# Patient Record
Sex: Male | Born: 1980 | Race: White | Hispanic: No | Marital: Single | Smoking: Current every day smoker
Health system: Southern US, Community
[De-identification: ages and names within clinical notes are randomized; demographics above are authoritative.]

---

## 2012-03-31 ENCOUNTER — Encounter (HOSPITAL_COMMUNITY): Payer: Self-pay

## 2012-03-31 ENCOUNTER — Emergency Department (HOSPITAL_COMMUNITY): Payer: Self-pay

## 2012-03-31 ENCOUNTER — Emergency Department (HOSPITAL_COMMUNITY)
Admission: EM | Admit: 2012-03-31 | Discharge: 2012-03-31 | Disposition: A | Discharge status: Home / Self Care | Payer: Self-pay | Attending: Emergency Medicine | Admitting: Emergency Medicine

## 2012-03-31 DIAGNOSIS — Y9239 Other specified sports and athletic area as the place of occurrence of the external cause: Secondary | ICD-10-CM | POA: Insufficient documentation

## 2012-03-31 DIAGNOSIS — S0003XA Contusion of scalp, initial encounter: Secondary | ICD-10-CM | POA: Insufficient documentation

## 2012-03-31 DIAGNOSIS — R0602 Shortness of breath: Secondary | ICD-10-CM | POA: Insufficient documentation

## 2012-03-31 DIAGNOSIS — R296 Repeated falls: Secondary | ICD-10-CM | POA: Insufficient documentation

## 2012-03-31 DIAGNOSIS — R55 Syncope and collapse: Secondary | ICD-10-CM | POA: Insufficient documentation

## 2012-03-31 DIAGNOSIS — S0180XA Unspecified open wound of other part of head, initial encounter: Secondary | ICD-10-CM | POA: Insufficient documentation

## 2012-03-31 DIAGNOSIS — S1093XA Contusion of unspecified part of neck, initial encounter: Secondary | ICD-10-CM | POA: Insufficient documentation

## 2012-03-31 LAB — CBC
HCT: 46 % (ref 39.0–52.0)
Hemoglobin: 16.2 g/dL (ref 13.0–17.0)
MCH: 30.9 pg (ref 26.0–34.0)
MCV: 87.6 fL (ref 78.0–100.0)
RBC: 5.25 MIL/uL (ref 4.22–5.81)

## 2012-03-31 LAB — DIFFERENTIAL
Eosinophils Absolute: 0.2 10*3/uL (ref 0.0–0.7)
Eosinophils Relative: 2 % (ref 0–5)
Lymphs Abs: 2.3 10*3/uL (ref 0.7–4.0)
Monocytes Absolute: 0.6 10*3/uL (ref 0.1–1.0)
Monocytes Relative: 5 % (ref 3–12)

## 2012-03-31 LAB — URINE MICROSCOPIC-ADD ON

## 2012-03-31 LAB — BASIC METABOLIC PANEL
BUN: 15 mg/dL (ref 6–23)
CO2: 22 mEq/L (ref 19–32)
Calcium: 10.5 mg/dL (ref 8.4–10.5)
GFR calc non Af Amer: 78 mL/min — ABNORMAL LOW (ref >90)
Glucose, Bld: 107 mg/dL — ABNORMAL HIGH (ref 70–99)

## 2012-03-31 LAB — URINALYSIS, ROUTINE W REFLEX MICROSCOPIC
Bilirubin Urine: NEGATIVE
Glucose, UA: NEGATIVE mg/dL
Hgb urine dipstick: NEGATIVE
Protein, ur: 30 mg/dL — AB
Urobilinogen, UA: 0.2 mg/dL (ref 0.0–1.0)

## 2012-03-31 MED ORDER — TETANUS-DIPHTH-ACELL PERTUSSIS 5-2.5-18.5 LF-MCG/0.5 IM SUSP
0.5000 mL | Freq: Once | INTRAMUSCULAR | Status: AC
  Administered 2012-03-31: 0.5 mL via INTRAMUSCULAR
  Filled 2012-03-31: qty 0.5

## 2012-03-31 MED ORDER — SODIUM CHLORIDE 0.9 % IV BOLUS (SEPSIS)
1000.0000 mL | Freq: Once | INTRAVENOUS | Status: AC
  Administered 2012-03-31: 1000 mL via INTRAVENOUS

## 2012-03-31 NOTE — Discharge Instructions (Signed)
Syncope You have had a fainting (syncopal) spell. A fainting episode is a sudden, short-lived loss of consciousness. It results in complete recovery. It occurs because there has been a temporary shortage of oxygen and/or sugar (glucose) to the brain. CAUSES   Blood pressure pills and other medications that may lower blood pressure below normal. Sudden changes in posture (sudden standing).   Over-medication. Take your medications as directed.   Standing too long. This can cause blood to pool in the legs.   Seizure disorders.   Low blood sugar (hypoglycemia) of diabetes. This more commonly causes coma.   Bearing down to go to the bathroom. This can cause your blood pressure to rise suddenly. Your body compensates by making the blood pressure too low when you stop bearing down.   Hardening of the arteries where the brain temporarily does not receive enough blood.   Irregular heart beat and circulatory problems.   Fear, emotional distress, injury, sight of blood, or illness.  Your caregiver will send you home if the syncope was from non-worrisome causes (benign). Depending on your age and health, you may stay to be monitored and observed. If you return home, have someone stay with you if your caregiver feels that is desirable. It is very important to keep all follow-up referrals and appointments in order to properly manage this condition. This is a serious problem which can lead to serious illness and death if not carefully managed.  WARNING: Do not drive or operate machinery until your caregiver feels that it is safe for you to do so. SEEK IMMEDIATE MEDICAL CARE IF:   You have another fainting episode or faint while lying or sitting down. DO NOT DRIVE YOURSELF. Call 911 if no other help is available.   You have chest pain, are feeling sick to your stomach (nausea), vomiting or abdominal pain.   You have an irregular heartbeat or one that is very fast (pulse over 120 beats per minute).    You have a loss of feeling in some part of your body or lose movement in your arms or legs.   You have difficulty with speech, confusion, severe weakness, or visual problems.   You become sweaty and/or feel light headed.  Make sure you are rechecked as instructed. Document Released: 09/28/2005 Document Revised: 09/17/2011 Document Reviewed: 05/19/2007 [employer] Patient Information 2012 [employer]. 

## 2012-03-31 NOTE — ED Notes (Signed)
Per ems, pt was at the gym and had an episode of syncope.  Pt reports that his dad was with him, and that the "episode" lasted for 1 min.  Pt is alert & oriented in triage.

## 2012-03-31 NOTE — ED Provider Notes (Signed)
History  Scribed for Dayton Bailiff, MD, the patient was seen in room APA09/APA09. This chart was scribed by Candelaria Stagers. The patient's care started at 5:03 PM   CSN: [id number]  Arrival date & time 03/31/12  1651   First MD Initiated Contact with Patient 03/31/12 1657      Chief Complaint  Patient presents with  . Seizures     The history is provided by the patient.   Brad Fernandez is a 31 y.o. male who presents to the Emergency Department complaining an episode of syncope that occurred earlier today while at the gym which lasted for about according to his father.  Pt's father states he turned gray, pupils dilated, and experienced SOB.  He denies hitting his head or losing bowel or bladder function.  Pt experienced similar sx about one month ago.      History reviewed. No pertinent past medical history.  History reviewed. No pertinent past surgical history.  No family history on file.  History  Substance Use Topics  . Smoking status: Current Everyday Smoker  . Smokeless tobacco: Not on file  . Alcohol Use: No      Review of Systems  Constitutional: Negative for fever, activity change, appetite change and fatigue.  HENT: Negative for congestion, facial swelling, rhinorrhea and neck pain.   Eyes: Negative for discharge and itching.  Respiratory: Negative for shortness of breath.   Gastrointestinal: Negative for abdominal pain.  Genitourinary: Negative for dysuria and difficulty urinating.  Musculoskeletal: Negative for myalgias, back pain and arthralgias.  Skin: Positive for wound (left forehead). Negative for rash.  Neurological: Positive for syncope.  Psychiatric/Behavioral: Negative for behavioral problems.    Allergies  Review of patient's allergies indicates no known allergies.  Home Medications  No current outpatient prescriptions on file.  BP 94/60  Pulse 81  Temp 97.3 F (36.3 C) (Oral)  Resp 18  Ht 5\' 8"  (1.727 m)  Wt 155 lb (70.308 kg)   BMI 23.57 kg/m2  SpO2 97%  Physical Exam  Nursing note and vitals reviewed. Constitutional: He is oriented to person, place, and time. He appears well-developed and well-nourished. No distress.  HENT:  Head: Normocephalic and atraumatic.       Small superficial laceration and hematoma to the left forehead.    Eyes: EOM are normal. Pupils are equal, round, and reactive to light.  Neck: Neck supple. No tracheal deviation present.  Cardiovascular: Normal rate.   Pulmonary/Chest: Effort normal. No respiratory distress.  Abdominal: Soft. He exhibits no distension.  Musculoskeletal: Normal range of motion. He exhibits no edema.  Neurological: He is alert and oriented to person, place, and time. No sensory deficit.  Skin: Skin is warm and dry.  Psychiatric: He has a normal mood and affect. His behavior is normal.    ED Course  Procedures  DIAGNOSTIC STUDIES: Oxygen Saturation is 97% on room air, normal by my interpretation.    COORDINATION OF CARE:  5:13PM Ordered: CT Head Wo Contrast; CBC; Differential; Basic metabolic panel; Urinalysis, Routine w reflex microscopic   Labs Reviewed  CBC - Abnormal; Notable for the following:    WBC 11.7 (*)     All other components within normal limits  DIFFERENTIAL - Abnormal; Notable for the following:    Neutro Abs 8.5 (*)     All other components within normal limits  BASIC METABOLIC PANEL - Abnormal; Notable for the following:    Glucose, Bld 107 (*)     GFR calc non  Af Amer 78 (*)     GFR calc Af Amer 90 (*)     All other components within normal limits  URINALYSIS, ROUTINE W REFLEX MICROSCOPIC - Abnormal; Notable for the following:    Color, Urine AMBER (*)  BIOCHEMICALS MAY BE AFFECTED BY COLOR   Protein, ur 30 (*)     All other components within normal limits  URINE MICROSCOPIC-ADD ON - Abnormal; Notable for the following:    Bacteria, UA FEW (*)     Casts HYALINE CASTS (*)     All other components within normal limits  URINE  CULTURE   Ct Head Wo Contrast  03/31/2012  *RADIOLOGY REPORT*  Clinical Data: Seizure.  Left forehead blunt trauma and laceration.  CT HEAD WITHOUT CONTRAST  Technique:  Contiguous axial images were obtained from the base of the skull through the vertex without contrast.  Comparison: None.  Findings: Mild left frontal scalp soft tissue swelling is seen.  No evidence of skull fracture or pneumocephalus.  There is no evidence of intracranial hemorrhage, brain edema or other signs of acute infarction.  There is no evidence of intracranial mass lesion or mass effect.  No abnormal extra-axial fluid collections are identified. No evidence of hydrocephalus.  IMPRESSION: Mild left frontal scalp soft tissue swelling.  No evidence of skull fracture or intracranial abnormality.  Original Report Authenticated By: Danae Orleans, M.D.     1. Syncope       MDM  Is consistent with vasovagal syncope. He received 2 L of IV fluid with improvement of his symptoms. EKG is unremarkable. CT head was performed as he struck his head and had evidence of trauma. This was negative. Steri-Strips were applied to the superficial laceration. Will be discharged home. Provided strict return precautions  I personally performed the services described in this documentation, which was scribed in my presence. The recorded information has been reviewed and considered.         Dayton Bailiff, MD 03/31/12 [phone]

## 2012-04-02 LAB — URINE CULTURE
Colony Count: NO GROWTH
Culture  Setup Time: 201306202100

## 2012-10-23 IMAGING — CT CT HEAD W/O CM
1 series · 16 of 30 positions shown, 20 images · non-contrast
Comparison: None.

CLINICAL DATA: Seizure.  Left forehead blunt trauma and laceration.

CT HEAD WITHOUT CONTRAST
TECHNIQUE: Contiguous axial images were obtained from the base of
the skull through the vertex without contrast.

[Series 2: headtrauma 4.8 h37s · axial · 0.45mm/px · z∈[+76,+205]mm · 16 of 30 slices shown, 20 images]
[im 2/30  brain]
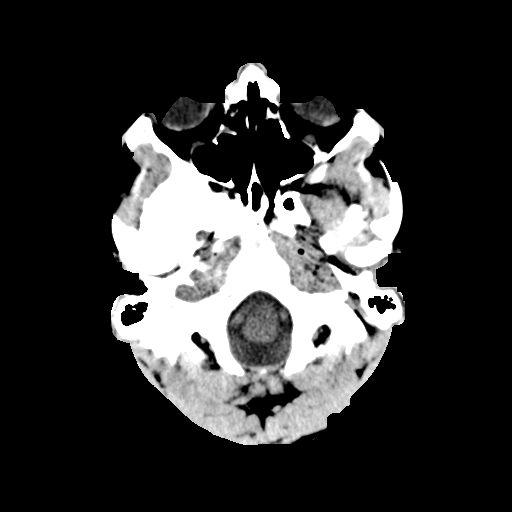
[im 2/30  bone]
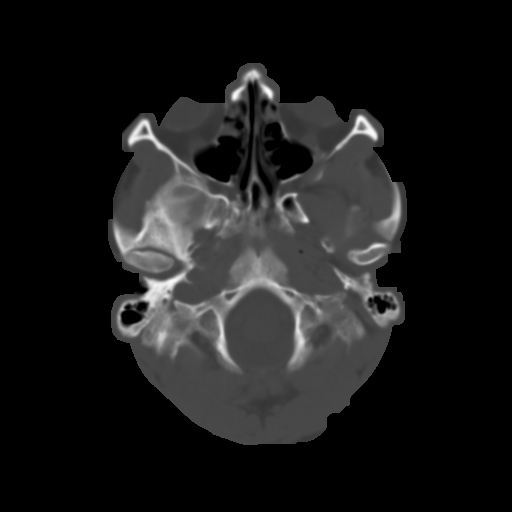
[im 4/30  brain]
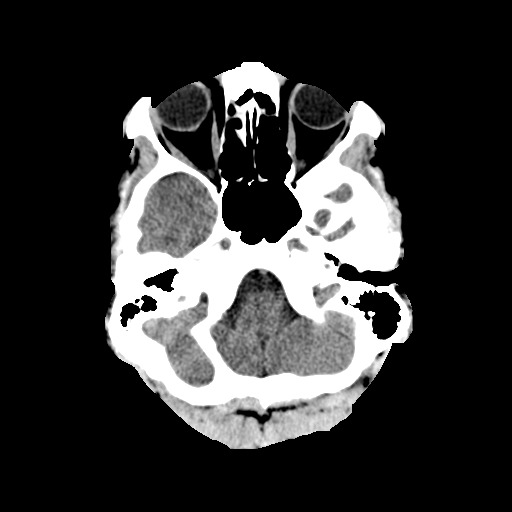
[im 6/30  brain]
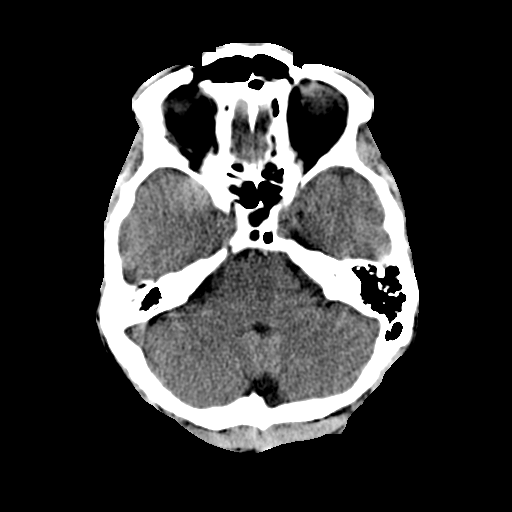
[im 8/30  brain]
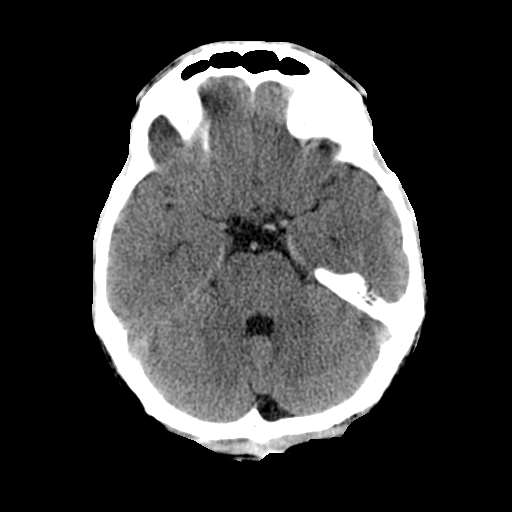
[im 9/30  brain]
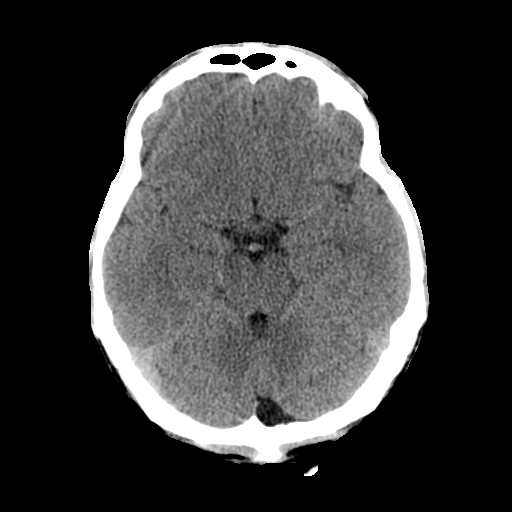
[im 9/30  bone]
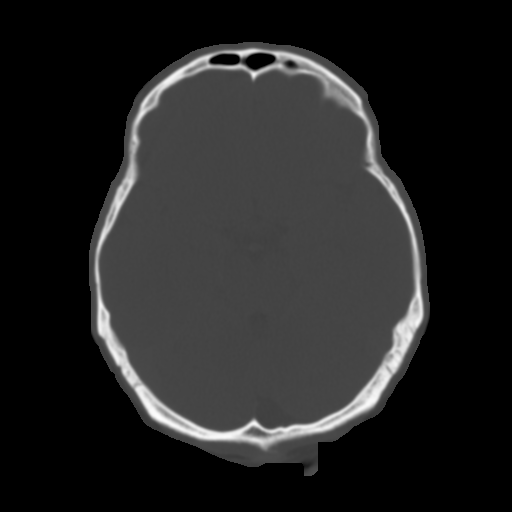
[im 11/30  brain]
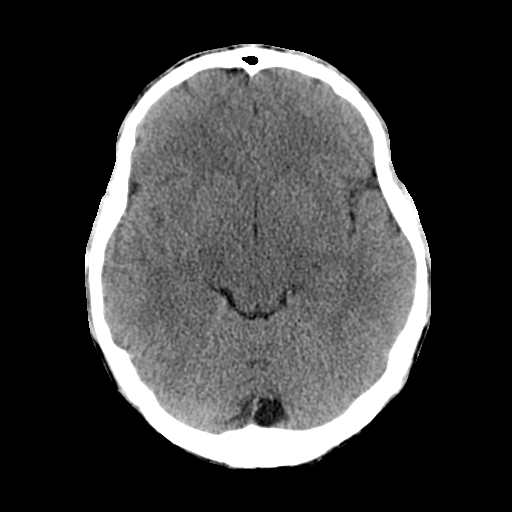
[im 13/30  brain]
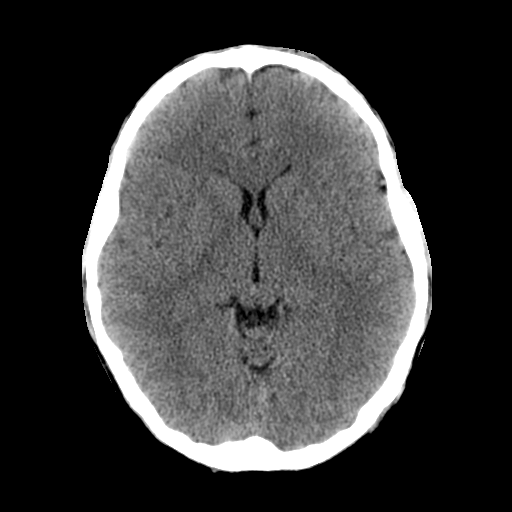
[im 15/30  brain]
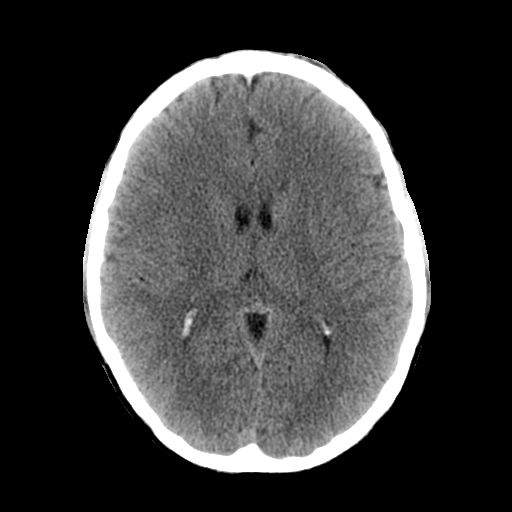
[im 16/30  brain]
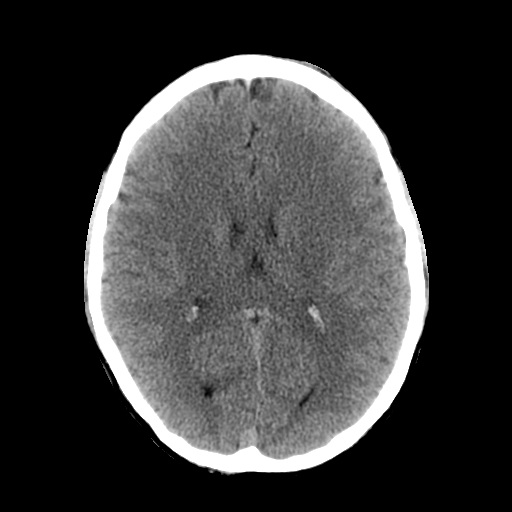
[im 16/30  bone]
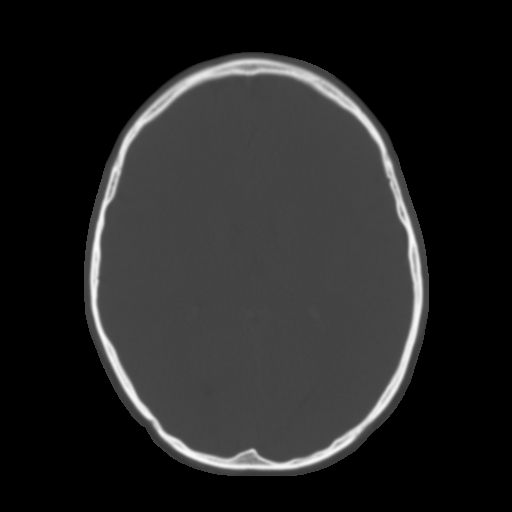
[im 18/30  brain]
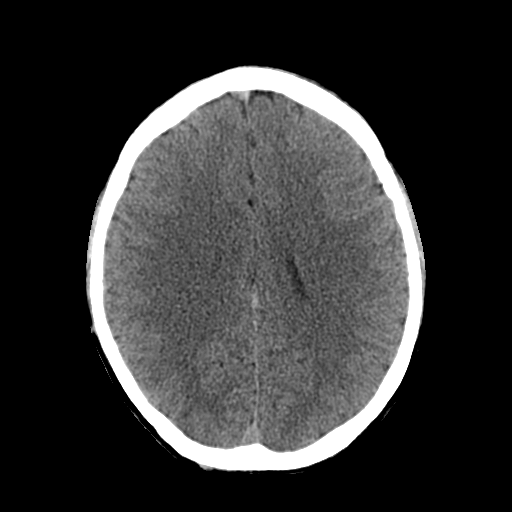
[im 20/30  brain]
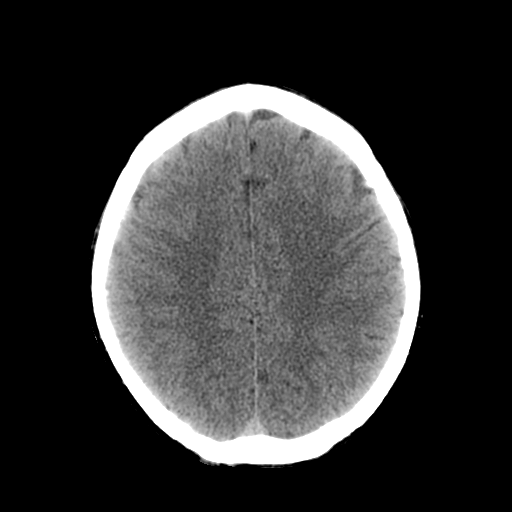
[im 22/30  brain]
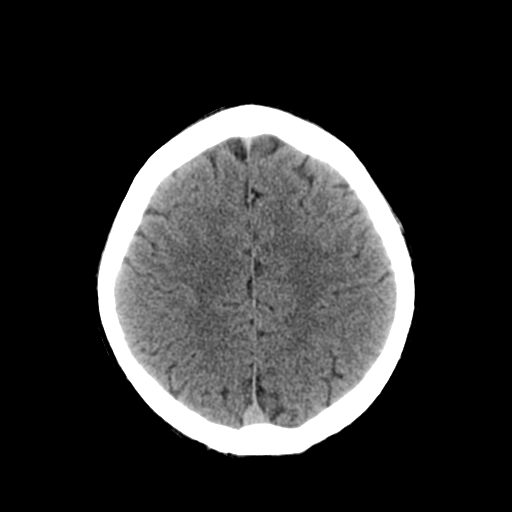
[im 23/30  brain]
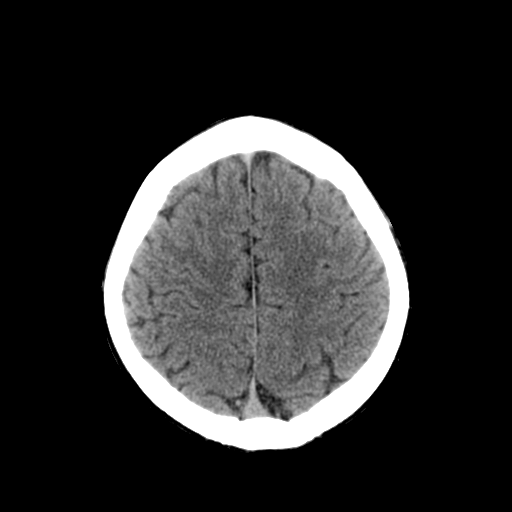
[im 23/30  bone]
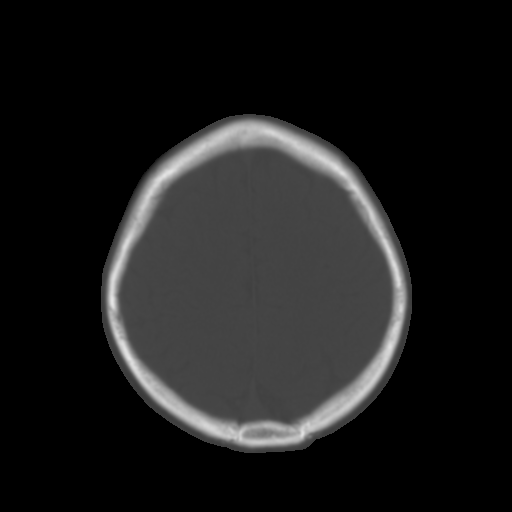
[im 25/30  brain]
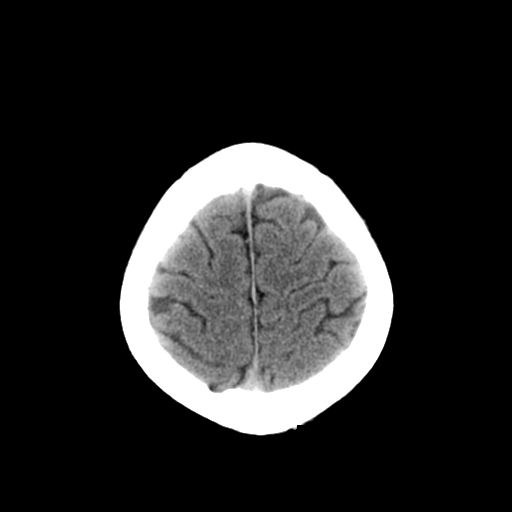
[im 27/30  brain]
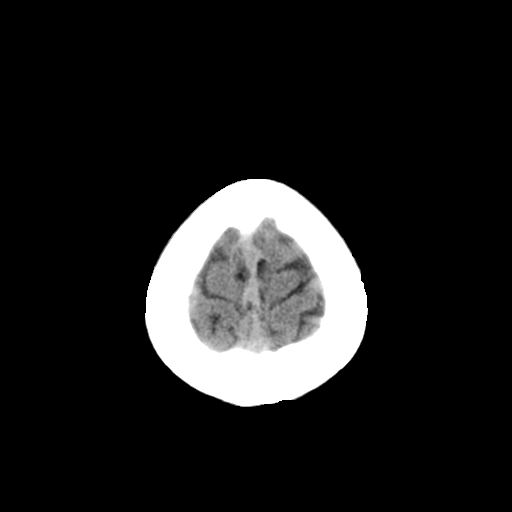
[im 29/30  brain]
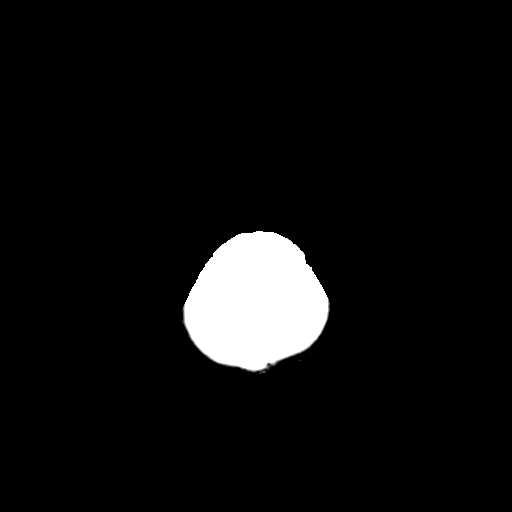

[16 of 30 positions shown; findings below may reference images not displayed]

FINDINGS: Mild left frontal scalp soft tissue swelling is seen.  No
evidence of skull fracture or pneumocephalus.

There is no evidence of intracranial hemorrhage, brain edema or
other signs of acute infarction.  There is no evidence of
intracranial mass lesion or mass effect.  No abnormal extra-axial
fluid collections are identified. No evidence of hydrocephalus.
IMPRESSION: Mild left frontal scalp soft tissue swelling.  No evidence of skull
fracture or intracranial abnormality.

## 2018-06-28 ENCOUNTER — Ambulatory Visit: Payer: Medicaid Other | Admitting: Orthopaedic Surgery

## 2018-06-28 ENCOUNTER — Encounter: Payer: Self-pay | Admitting: Orthopaedic Surgery

## 2018-11-05 ENCOUNTER — Emergency Department (HOSPITAL_COMMUNITY)
Admission: EM | Admit: 2018-11-05 | Discharge: 2018-11-06 | Disposition: A | Discharge status: Home / Self Care | Payer: Medicaid Other | Attending: Emergency Medicine | Admitting: Emergency Medicine

## 2018-11-05 ENCOUNTER — Other Ambulatory Visit: Payer: Self-pay

## 2018-11-05 ENCOUNTER — Encounter (HOSPITAL_COMMUNITY): Payer: Self-pay | Admitting: Emergency Medicine

## 2018-11-05 ENCOUNTER — Emergency Department (HOSPITAL_COMMUNITY): Payer: Medicaid Other

## 2018-11-05 DIAGNOSIS — Z781 Physical restraint status: Secondary | ICD-10-CM | POA: Diagnosis not present

## 2018-11-05 DIAGNOSIS — F1290 Cannabis use, unspecified, uncomplicated: Secondary | ICD-10-CM

## 2018-11-05 DIAGNOSIS — F17200 Nicotine dependence, unspecified, uncomplicated: Secondary | ICD-10-CM | POA: Insufficient documentation

## 2018-11-05 DIAGNOSIS — X788XXA Intentional self-harm by other sharp object, initial encounter: Secondary | ICD-10-CM | POA: Insufficient documentation

## 2018-11-05 DIAGNOSIS — S56222A Laceration of other flexor muscle, fascia and tendon at forearm level, left arm, initial encounter: Secondary | ICD-10-CM | POA: Diagnosis present

## 2018-11-05 DIAGNOSIS — F10921 Alcohol use, unspecified with intoxication delirium: Secondary | ICD-10-CM

## 2018-11-05 DIAGNOSIS — F10121 Alcohol abuse with intoxication delirium: Secondary | ICD-10-CM | POA: Diagnosis not present

## 2018-11-05 DIAGNOSIS — S41112A Laceration without foreign body of left upper arm, initial encounter: Secondary | ICD-10-CM

## 2018-11-05 DIAGNOSIS — T1491XA Suicide attempt, initial encounter: Secondary | ICD-10-CM

## 2018-11-05 DIAGNOSIS — S51811A Laceration without foreign body of right forearm, initial encounter: Secondary | ICD-10-CM | POA: Diagnosis not present

## 2018-11-05 DIAGNOSIS — Y92003 Bedroom of unspecified non-institutional (private) residence as the place of occurrence of the external cause: Secondary | ICD-10-CM | POA: Diagnosis not present

## 2018-11-05 DIAGNOSIS — S41111A Laceration without foreign body of right upper arm, initial encounter: Secondary | ICD-10-CM

## 2018-11-05 LAB — URINALYSIS, ROUTINE W REFLEX MICROSCOPIC
Bilirubin Urine: NEGATIVE
GLUCOSE, UA: NEGATIVE mg/dL
KETONES UR: NEGATIVE mg/dL
LEUKOCYTES UA: NEGATIVE
NITRITE: NEGATIVE
PROTEIN: NEGATIVE mg/dL
Specific Gravity, Urine: 1.002 — ABNORMAL LOW (ref 1.005–1.030)
pH: 6 (ref 5.0–8.0)

## 2018-11-05 LAB — CBC WITH DIFFERENTIAL/PLATELET
ABS IMMATURE GRANULOCYTES: 0.04 10*3/uL (ref 0.00–0.07)
BASOS ABS: 0.1 10*3/uL (ref 0.0–0.1)
BASOS PCT: 0 %
Eosinophils Absolute: 0 10*3/uL (ref 0.0–0.5)
Eosinophils Relative: 0 %
HCT: 47.5 % (ref 39.0–52.0)
Hemoglobin: 15.7 g/dL (ref 13.0–17.0)
IMMATURE GRANULOCYTES: 0 %
LYMPHS PCT: 11 %
Lymphs Abs: 1.5 10*3/uL (ref 0.7–4.0)
MCH: 28.9 pg (ref 26.0–34.0)
MCHC: 33.1 g/dL (ref 30.0–36.0)
MCV: 87.3 fL (ref 80.0–100.0)
Monocytes Absolute: 0.4 10*3/uL (ref 0.1–1.0)
Monocytes Relative: 3 %
NEUTROS ABS: 11.4 10*3/uL — AB (ref 1.7–7.7)
NEUTROS PCT: 86 %
NRBC: 0 % (ref 0.0–0.2)
PLATELETS: 294 10*3/uL (ref 150–400)
RBC: 5.44 MIL/uL (ref 4.22–5.81)
RDW: 12.4 % (ref 11.5–15.5)
WBC: 13.4 10*3/uL — AB (ref 4.0–10.5)

## 2018-11-05 LAB — COMPREHENSIVE METABOLIC PANEL
ALBUMIN: 4.3 g/dL (ref 3.5–5.0)
ALT: 13 U/L (ref 0–44)
AST: 33 U/L (ref 15–41)
Alkaline Phosphatase: 54 U/L (ref 38–126)
Anion gap: 11 (ref 5–15)
BUN: 6 mg/dL (ref 6–20)
CHLORIDE: 108 mmol/L (ref 98–111)
CO2: 27 mmol/L (ref 22–32)
Calcium: 9.1 mg/dL (ref 8.9–10.3)
Creatinine, Ser: 1.06 mg/dL (ref 0.61–1.24)
GFR calc non Af Amer: >60 mL/min (ref >60)
GLUCOSE: 107 mg/dL — AB (ref 70–99)
Potassium: 3.6 mmol/L (ref 3.5–5.1)
SODIUM: 146 mmol/L — AB (ref 135–145)
Total Bilirubin: 0.5 mg/dL (ref 0.3–1.2)
Total Protein: 7.1 g/dL (ref 6.5–8.1)

## 2018-11-05 LAB — ACETAMINOPHEN LEVEL: Acetaminophen (Tylenol), Serum: <10 ug/mL — ABNORMAL LOW (ref 10–30)

## 2018-11-05 LAB — TYPE AND SCREEN
ABO/RH(D): A POS
Antibody Screen: NEGATIVE

## 2018-11-05 LAB — SALICYLATE LEVEL: Salicylate Lvl: <7 mg/dL (ref 2.8–30.0)

## 2018-11-05 LAB — RAPID URINE DRUG SCREEN, HOSP PERFORMED
Amphetamines: NOT DETECTED
Barbiturates: NOT DETECTED
Benzodiazepines: NOT DETECTED
Cocaine: NOT DETECTED
Opiates: NOT DETECTED
Tetrahydrocannabinol: POSITIVE — AB

## 2018-11-05 LAB — ETHANOL: ALCOHOL ETHYL (B): 273 mg/dL — AB (ref <10)

## 2018-11-05 LAB — CK: CK TOTAL: 659 U/L — AB (ref 49–397)

## 2018-11-05 MED ORDER — HALOPERIDOL LACTATE 5 MG/ML IJ SOLN: 5.0000 mg | Freq: Once | INTRAMUSCULAR | Status: DC

## 2018-11-05 MED ORDER — TETANUS-DIPHTH-ACELL PERTUSSIS 5-2.5-18.5 LF-MCG/0.5 IM SUSP
0.5000 mL | Freq: Once | INTRAMUSCULAR | Status: AC
  Administered 2018-11-05: 0.5 mL via INTRAMUSCULAR
  Filled 2018-11-05: qty 0.5

## 2018-11-05 MED ORDER — FENTANYL CITRATE (PF) 100 MCG/2ML IJ SOLN: 50.0000 ug | Freq: Once | INTRAMUSCULAR | Status: DC

## 2018-11-05 MED ORDER — HALOPERIDOL LACTATE 5 MG/ML IJ SOLN
5.0000 mg | Freq: Once | INTRAMUSCULAR | Status: AC
  Administered 2018-11-05: 5 mg via INTRAMUSCULAR
  Filled 2018-11-05: qty 1

## 2018-11-05 MED ORDER — MIDAZOLAM HCL 2 MG/2ML IJ SOLN
5.0000 mg | Freq: Once | INTRAMUSCULAR | Status: AC
  Administered 2018-11-05: 5 mg via INTRAMUSCULAR

## 2018-11-05 MED ORDER — MIDAZOLAM HCL 2 MG/2ML IJ SOLN
5.0000 mg | Freq: Once | INTRAMUSCULAR | Status: DC
  Filled 2018-11-05: qty 6

## 2018-11-05 MED ORDER — MIDAZOLAM HCL 5 MG/5ML IJ SOLN: 5.0000 mg | Freq: Once | INTRAMUSCULAR | Status: DC

## 2018-11-05 NOTE — ED Notes (Signed)
Pt placed on end tidal CO2.

## 2018-11-05 NOTE — ED Provider Notes (Signed)
[facility] EMERGENCY DEPARTMENT Provider Note   CSN: [id number] Arrival date & time: 11/05/18  2039     History   Chief Complaint Chief Complaint  Patient presents with  . Suicide Attempt  . Alcohol Intoxication    HPI Brad Fernandez is a 38 y.o. male.  HPI   Brad Fernandez is a 38 y.o. male with PMH of unknown who presents with refill on foreskin at the bedside with possible intoxication versus other cause for abdominal status and reportedly self-inflicted lacerations to both forearms.  The patient reportedly was at home with his wife when he cut both of his forearms.  Report of possible ethanol intoxication.  EMS had significant difficulty obtaining any vital signs or glucose prior to arrival as the patient was very combative.  And cuffs on initial presentation.  Spoke to the patient's mother at [phone] he stated they were on their way to the ED.  She states that the patient's wife is not coming to the ED but was with him at the house earlier this evening when she had called the patient's parents after he reportedly cut both of his arms.  He reportedly had alcohol tonight but they are not sure how much.  He reportedly uses marijuana intermittently but there is no known use of other drugs.  No diagnosed psychiatric disorders in the past and they have not noticed signs or symptoms consistent with mania or schizophrenia per her report.  He has appeared to be more stressed recently and she is not sure that he has been sleeping well but nobody has confirmed insomnia.  History reviewed. No pertinent past medical history.  There are no active problems to display for this patient.   History reviewed. No pertinent surgical history.      Home Medications    Prior to Admission medications   Not on File    Family History No family history on file.  Social History Social History   Tobacco Use  . Smoking status: Current Every Day Smoker  . Smokeless  tobacco: Never Used  Substance Use Topics  . Alcohol use: Yes  . Drug use: No     Allergies   Patient has no known allergies.   Review of Systems Review of Systems  Unable to perform ROS: Mental status change     Physical Exam Updated Vital Signs BP 120/80   Pulse 79   Temp 98.2 F (36.8 C) (Oral)   Resp 17   Ht 5\' 8"  (1.727 m)   Wt 63.5 kg   SpO2 95%   BMI 21.29 kg/m   Physical Exam Vitals signs and nursing note reviewed.  Constitutional:      General: He is in acute distress.     Appearance: He is well-developed. He is ill-appearing.  HENT:     Head: Normocephalic and atraumatic.  Eyes:     Conjunctiva/sclera: Conjunctivae normal.  Neck:     Musculoskeletal: Neck supple.  Cardiovascular:     Rate and Rhythm: Regular rhythm. Tachycardia present.     Pulses:          Radial pulses are 2+ on the right side and 2+ on the left side.     Heart sounds: No murmur.     Comments: HR 90s while not upset, 110s when more agitated. Pulmonary:     Effort: Pulmonary effort is normal. No respiratory distress.     Breath sounds: Normal breath sounds.  Abdominal:     Palpations: Abdomen  is soft.     Tenderness: There is no abdominal tenderness.  Skin:    General: Skin is warm and dry.  Neurological:     Mental Status: He is alert. He is confused.     GCS: GCS eye subscore is 4. GCS verbal subscore is 4. GCS motor subscore is 6.     Comments: Moves all 4 extremities. Appears to have intact flexor/extensor mechanisms of both left and right hands/forearms. Will not answer questions re sensation but withdraws to pain.   Psychiatric:        Behavior: Behavior is uncooperative.       ED Treatments / Results  Labs (all labs ordered are listed, but only abnormal results are displayed) Labs Reviewed  CBC WITH DIFFERENTIAL/PLATELET - Abnormal; Notable for the following components:      Result Value   WBC 13.4 (*)    Neutro Abs 11.4 (*)    All other components within  normal limits  COMPREHENSIVE METABOLIC PANEL - Abnormal; Notable for the following components:   Sodium 146 (*)    Glucose, Bld 107 (*)    All other components within normal limits  ACETAMINOPHEN LEVEL - Abnormal; Notable for the following components:   Acetaminophen (Tylenol), Serum <10 (*)    All other components within normal limits  ETHANOL - Abnormal; Notable for the following components:   Alcohol, Ethyl (B) 273 (*)    All other components within normal limits  URINALYSIS, ROUTINE W REFLEX MICROSCOPIC - Abnormal; Notable for the following components:   Color, Urine STRAW (*)    Specific Gravity, Urine 1.002 (*)    Hgb urine dipstick LARGE (*)    Bacteria, UA RARE (*)    All other components within normal limits  RAPID URINE DRUG SCREEN, HOSP PERFORMED - Abnormal; Notable for the following components:   Tetrahydrocannabinol POSITIVE (*)    All other components within normal limits  CK - Abnormal; Notable for the following components:   Total CK 659 (*)    All other components within normal limits  SALICYLATE LEVEL  CBG MONITORING, ED  TYPE AND SCREEN  ABO/RH    EKG EKG Interpretation  Date/Time:  Saturday November 05 2018 20:42:37 EST Ventricular Rate:  100 PR Interval:    QRS Duration: 104 QT Interval:  340 QTC Calculation: 439 R Axis:   88 Text Interpretation:  Sinus tachycardia No previous tracing Confirmed by Cathren LaineSteinl, Kevin ([id number]) on 11/05/2018 10:46:40 PM   Radiology No results found.  Procedures Procedures (including critical care time)  Medications Ordered in ED Medications  fentaNYL (SUBLIMAZE) injection 50 mcg (has no administration in time range)  ceFAZolin (ANCEF) 2-4 GM/100ML-% IVPB (has no administration in time range)  haloperidol lactate (HALDOL) injection 5 mg (5 mg Intramuscular Given 11/05/18 2048)  Tdap (BOOSTRIX) injection 0.5 mL (0.5 mLs Intramuscular Given 11/05/18 2157)  midazolam (VERSED) injection 5 mg (5 mg Intramuscular Given 11/05/18  2153)     Initial Impression / Assessment and Plan / ED Course  I have reviewed the triage vital signs and the nursing notes.  Pertinent labs & imaging results that were available during my care of the patient were reviewed by me and considered in my medical decision making (see chart for details).     MDM:  Imaging: X-rays of the bilateral forearms ordered but not yet completed  ED Provider Interpretation of EKG: Sinus tachycardia with a rate of 100 bpm, normal axis, no ST segment elevation or depression, pathologic T wave  changes or significant interval regularity.  No bundle-branch blocks.  No delta wave.  No evidence for Brugada syndrome.  Labs: UDS positive for THC, UA with no evidence for infection, CK 659, salicylate negative, alcohol 273, Tylenol 10, CMP unremarkable, white count 13  On initial evaluation, patient appears ill and is agitated. Afebrile and hemodynamically stable.  Alert but disoriented and altered.  Presents after apparently causing some infected lacerations to both forearms as detailed above.  On exam, patient has large forearm laceration on the left spanning anterior to the antecubital fossa to the wrist predominantly over the volar surface.  Hemostatic.  Approximately 6 cm laceration to the right proximal forearm anterior to the antecubital fossa which is hemostatic.  No evidence for acute arterial injury.  2+ radial pulses bilaterally.  Patient is largely noncompliant with commands but is spontaneously flexing and extending his wrists and fingers on both hands without significant difficulty.  He was in handcuffs during evaluation and was altered, thrashing about the bed, and attempting to bite providers at times.  Not redirectable by voice.  Patient was found to be normotensive and not tachycardic he was given 5 mg of IM Haldol and tetanus booster in the ED.  Thereafter patient initially called but then became more agitated and was given 5 mg of IM Versed.  He was more  calm thereafter.  End-tidal CO2 placed.  Given his significant agitation and apparent intoxication with attempt to harm himself IVC was placed.  X-rays ordered but with stimulation the patient to get more agitated.  Ethanol significantly elevated as above and UDS positive for THC.  Otherwise no evidence for significant metabolic derangement.  Suspect his leukocytosis is secondary to demargination in the setting of trauma and intoxication.  He is not anticoagulated hemostatic.  On multiple attempts to perform exam there was some difficulty ensuring that the patient was neurovascularly intact.  With coaching the patient was able to give "okay sign" with his left hand.  He would not attempt to cross his fingers or touch pinky to thumb.  Pulses remained intact.  Extensive laceration to the left arm with possible tendon involvement and muscle belly involvement as well.  Felt that he might be better served in the operating room for repair of this and hand surgery was consulted.  Given IV fentanyl for pain.  He was taken to the operating room by hand surgery.  Plan for the patient to return to the emergency department after surgery for reevaluation.  He will require metabolism of his alcohol and will need psychiatric reassessment by the ED team.  May require TTS evaluation.  I spoke extensively to the patient's parents in the consultation room and gave them a general idea of his condition and the extent of his wounds requiring operative repair.  They spontaneously provided that when the patient gets very drunk he has been predisposed to act in a similar fashion in the past.  Suspect that most of his symptoms are secondary to alcohol and drug use.  No known psych history.  I attempted to call the patient's wife at the phone number provided by the patient's mother [phone]) but was unable to contact her.  Patient was taken to the operating room around the time of transfer of care to oncoming team.  Please see their  notes for additional details.  The plan for this patient was discussed with Dr. Denton Lank who voiced agreement and who oversaw evaluation and treatment of this patient.    Final Clinical  Impressions(s) / ED Diagnoses   Final diagnoses:  Alcohol intoxication with delirium (HCC)  Suicide attempt (HCC)  Marijuana use  Laceration of left upper extremity with complication, initial encounter  Arm laceration, right, initial encounter    ED Discharge Orders    None       Alin Hutchins, Sherryle LisJames F II, MD 11/06/18 Georgiann [id number]    Cathren LaineSteinl, Kevin, MD 11/08/18 1226

## 2018-11-05 NOTE — ED Triage Notes (Signed)
Pt BIB [facility] and [place] [facility] in handcuffs for a suicide attempt and ETOH intoxication. EMS was called to assist [facility] for a laceration. EMS advised they arrived on scene and the pt has lacerated his left arm to the bone and his right arm to the muscle. EMS advised the pt has been combative the entire time. EMS advised they were unable to get vital signs on the pt and were unable to bandage the wounds. EMS and [place] [facility] advised the pt consumed ETOH but they are not sure if he ingested anything else.  Pt states there is nothing wrong with him and he does not want to be touched.

## 2018-11-06 ENCOUNTER — Encounter (HOSPITAL_COMMUNITY): Payer: Self-pay | Admitting: Behavioral Health

## 2018-11-06 ENCOUNTER — Emergency Department (HOSPITAL_COMMUNITY): Payer: Medicaid Other | Admitting: Anesthesiology

## 2018-11-06 ENCOUNTER — Encounter (HOSPITAL_COMMUNITY)
Admission: EM | Disposition: A | Discharge status: Home / Self Care | Payer: Self-pay | Source: Home / Self Care | Attending: Emergency Medicine

## 2018-11-06 HISTORY — PX: LACERATION REPAIR: SHX5284

## 2018-11-06 LAB — ABO/RH: ABO/RH(D): A POS

## 2018-11-06 SURGERY — REPAIR, LACERATION, 2 OR MORE
Anesthesia: General | Laterality: Right

## 2018-11-06 MED ORDER — CEFAZOLIN SODIUM-DEXTROSE 2-4 GM/100ML-% IV SOLN
INTRAVENOUS | Status: AC
  Filled 2018-11-06: qty 100

## 2018-11-06 MED ORDER — CEFAZOLIN SODIUM-DEXTROSE 2-3 GM-%(50ML) IV SOLR
INTRAVENOUS | Status: DC | PRN
  Administered 2018-11-06: 2 g via INTRAVENOUS

## 2018-11-06 MED ORDER — DEXMEDETOMIDINE HCL IN NACL 200 MCG/50ML IV SOLN
INTRAVENOUS | Status: DC | PRN
  Administered 2018-11-06: 8 ug via INTRAVENOUS
  Administered 2018-11-06: 2 ug via INTRAVENOUS
  Administered 2018-11-06 (×2): 4 ug via INTRAVENOUS

## 2018-11-06 MED ORDER — PROPOFOL 10 MG/ML IV BOLUS
INTRAVENOUS | Status: AC
  Filled 2018-11-06: qty 20

## 2018-11-06 MED ORDER — LIDOCAINE HCL (CARDIAC) PF 100 MG/5ML IV SOSY
PREFILLED_SYRINGE | INTRAVENOUS | Status: DC | PRN
  Administered 2018-11-06: 80 mg via INTRATRACHEAL

## 2018-11-06 MED ORDER — SODIUM CHLORIDE 0.9 % IR SOLN
Status: DC | PRN
  Administered 2018-11-06: 1000 mL

## 2018-11-06 MED ORDER — PROPOFOL 10 MG/ML IV BOLUS
INTRAVENOUS | Status: DC | PRN
  Administered 2018-11-06: 150 mg via INTRAVENOUS

## 2018-11-06 MED ORDER — MIDAZOLAM HCL 2 MG/2ML IJ SOLN
INTRAMUSCULAR | Status: AC
  Filled 2018-11-06: qty 2

## 2018-11-06 MED ORDER — FENTANYL CITRATE (PF) 250 MCG/5ML IJ SOLN
INTRAMUSCULAR | Status: AC
  Filled 2018-11-06: qty 5

## 2018-11-06 MED ORDER — SUCCINYLCHOLINE 20MG/ML (10ML) SYRINGE FOR MEDFUSION PUMP - OPTIME
INTRAMUSCULAR | Status: DC | PRN
  Administered 2018-11-06: 100 mg via INTRAVENOUS

## 2018-11-06 MED ORDER — LACTATED RINGERS IV SOLN
INTRAVENOUS | Status: DC | PRN
  Administered 2018-11-06: 01:00:00 via INTRAVENOUS

## 2018-11-06 MED ORDER — MIDAZOLAM HCL 2 MG/2ML IJ SOLN
INTRAMUSCULAR | Status: DC | PRN
  Administered 2018-11-06: 2 mg via INTRAVENOUS

## 2018-11-06 MED ORDER — DEXMEDETOMIDINE HCL IN NACL 200 MCG/50ML IV SOLN
INTRAVENOUS | Status: AC
  Filled 2018-11-06: qty 50

## 2018-11-06 SURGICAL SUPPLY — 47 items
BANDAGE ACE 3X5.8 VEL STRL LF (GAUZE/BANDAGES/DRESSINGS) ×3 IMPLANT
BANDAGE ACE 4X5 VEL STRL LF (GAUZE/BANDAGES/DRESSINGS) ×3 IMPLANT
BNDG CMPR 9X4 STRL LF SNTH (GAUZE/BANDAGES/DRESSINGS)
BNDG COHESIVE 4X5 TAN STRL (GAUZE/BANDAGES/DRESSINGS) ×3 IMPLANT
BNDG CONFORM 2 STRL LF (GAUZE/BANDAGES/DRESSINGS) IMPLANT
BNDG ESMARK 4X9 LF (GAUZE/BANDAGES/DRESSINGS) IMPLANT
BNDG GAUZE ELAST 4 BULKY (GAUZE/BANDAGES/DRESSINGS) ×6 IMPLANT
CORDS BIPOLAR (ELECTRODE) IMPLANT
COVER SURGICAL LIGHT HANDLE (MISCELLANEOUS) ×6 IMPLANT
COVER WAND RF STERILE (DRAPES) ×3 IMPLANT
CUFF TOURNIQUET SINGLE 18IN (TOURNIQUET CUFF) ×3 IMPLANT
DECANTER SPIKE VIAL GLASS SM (MISCELLANEOUS) ×3 IMPLANT
DRAPE SURG 17X23 STRL (DRAPES) ×3 IMPLANT
DURAPREP 26ML APPLICATOR (WOUND CARE) ×3 IMPLANT
GAUZE PACKING IODOFORM 1/4X15 (GAUZE/BANDAGES/DRESSINGS) IMPLANT
GAUZE PACKING IODOFORM 1/4X5 (PACKING) IMPLANT
GAUZE SPONGE 4X4 12PLY STRL (GAUZE/BANDAGES/DRESSINGS) ×6 IMPLANT
GAUZE SPONGE 4X4 12PLY STRL LF (GAUZE/BANDAGES/DRESSINGS) ×6 IMPLANT
GAUZE XEROFORM 1X8 LF (GAUZE/BANDAGES/DRESSINGS) ×3 IMPLANT
GAUZE XEROFORM 5X9 LF (GAUZE/BANDAGES/DRESSINGS) ×6 IMPLANT
GLOVE SURG SYN 8.0 (GLOVE) ×3 IMPLANT
GOWN STRL REUS W/ TWL LRG LVL3 (GOWN DISPOSABLE) ×1 IMPLANT
GOWN STRL REUS W/ TWL XL LVL3 (GOWN DISPOSABLE) ×1 IMPLANT
GOWN STRL REUS W/TWL LRG LVL3 (GOWN DISPOSABLE) ×3
GOWN STRL REUS W/TWL XL LVL3 (GOWN DISPOSABLE) ×3
KIT BASIN OR (CUSTOM PROCEDURE TRAY) ×3 IMPLANT
KIT TURNOVER KIT B (KITS) ×3 IMPLANT
MANIFOLD NEPTUNE II (INSTRUMENTS) ×3 IMPLANT
NEEDLE HYPO 25GX1X1/2 BEV (NEEDLE) IMPLANT
NEEDLE HYPO 25X1 1.5 SAFETY (NEEDLE) ×3 IMPLANT
NS IRRIG 1000ML POUR BTL (IV SOLUTION) ×3 IMPLANT
PACK ORTHO EXTREMITY (CUSTOM PROCEDURE TRAY) ×3 IMPLANT
PAD ARMBOARD 7.5X6 YLW CONV (MISCELLANEOUS) ×6 IMPLANT
PAD CAST 4YDX4 CTTN HI CHSV (CAST SUPPLIES) ×2 IMPLANT
PADDING CAST COTTON 4X4 STRL (CAST SUPPLIES) ×6
SPONGE LAP 18X18 X RAY DECT (DISPOSABLE) ×3 IMPLANT
SUT VICRYL RAPIDE 4/0 PS 2 (SUTURE) IMPLANT
SWAB COLLECTION DEVICE MRSA (MISCELLANEOUS) ×3 IMPLANT
SWAB CULTURE ESWAB REG 1ML (MISCELLANEOUS) IMPLANT
SYR CONTROL 10ML LL (SYRINGE) ×3 IMPLANT
TAPE CLOTH SURG 4X10 WHT LF (GAUZE/BANDAGES/DRESSINGS) ×3 IMPLANT
TOWEL OR 17X24 6PK STRL BLUE (TOWEL DISPOSABLE) ×3 IMPLANT
TOWEL OR 17X26 10 PK STRL BLUE (TOWEL DISPOSABLE) ×3 IMPLANT
TUBE CONNECTING 12'X1/4 (SUCTIONS) ×1
TUBE CONNECTING 12X1/4 (SUCTIONS) ×2 IMPLANT
UNDERPAD 30X30 (UNDERPADS AND DIAPERS) ×3 IMPLANT
YANKAUER SUCT BULB TIP NO VENT (SUCTIONS) ×3 IMPLANT

## 2018-11-06 NOTE — Transfer of Care (Signed)
Immediate Anesthesia Transfer of Care Note  Patient: Brad Fernandez  Procedure(s) Performed: REPAIR RIGHT FOREARM LACERATIONS (Right )  Patient Location: PACU  Anesthesia Type:General  Level of Consciousness: sedated  Airway & Oxygen Therapy: Patient connected to face mask oxygen  Post-op Assessment: Report given to RN and Post -op Vital signs reviewed and stable  Post vital signs: Reviewed and stable  Last Vitals:  Vitals Value Taken Time  BP 114/84 11/06/2018  1:55 AM  Temp 36.2 C 11/06/2018  1:53 AM  Pulse 65 11/06/2018  2:02 AM  Resp 21 11/06/2018  2:02 AM  SpO2 99 % 11/06/2018  2:02 AM  Vitals shown include unvalidated device data.  Last Pain:  Vitals:   11/06/18 0153  TempSrc:   PainSc: Asleep         Complications: No apparent anesthesia complications

## 2018-11-06 NOTE — ED Notes (Addendum)
Per Lorin Picket, RN, patient is not sober and unable to complete TTS assessment. TTS to attempt at a later time. BAL 273
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
## 2018-11-06 NOTE — Anesthesia Postprocedure Evaluation (Signed)
Anesthesia Post Note  Patient: Brad Fernandez  Procedure(s) Performed: REPAIR RIGHT FOREARM LACERATIONS, REPAIR OF LEFT FOREARM LACERATIONS WITH MUSCLE REPAIR. (Right )     Patient location during evaluation: PACU Anesthesia Type: General Level of consciousness: patient uncooperative Pain management: pain level controlled Vital Signs Assessment: post-procedure vital signs reviewed and stable Respiratory status: spontaneous breathing, nonlabored ventilation, respiratory function stable and patient connected to nasal cannula oxygen Cardiovascular status: blood pressure returned to baseline and stable Postop Assessment: no apparent nausea or vomiting Anesthetic complications: no    Last Vitals:  Vitals:   11/06/18 0200 11/06/18 0215  BP: 104/75 101/79  Pulse: 65 62  Resp: 20 20  Temp:  (!) 36.2 C  SpO2: 100% 95%    Last Pain:  Vitals:   11/06/18 0215  TempSrc:   PainSc: Asleep                 Cecile Hearing
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
## 2018-11-06 NOTE — Anesthesia Procedure Notes (Signed)
Procedure Name: Intubation Date/Time: 11/06/2018 1:03 AM Performed by: Molli Hazard, CRNA Pre-anesthesia Checklist: Patient identified, Emergency Drugs available, Suction available and Patient being monitored Patient Re-evaluated:Patient Re-evaluated prior to induction Oxygen Delivery Method: Circle system utilized Preoxygenation: Pre-oxygenation with 100% oxygen Induction Type: IV induction, Rapid sequence and Cricoid Pressure applied Laryngoscope Size: Miller and 2 Grade View: Grade I Tube type: Oral Tube size: 7.5 mm Number of attempts: 1 Airway Equipment and Method: Stylet Placement Confirmation: ETT inserted through vocal cords under direct vision,  positive ETCO2 and breath sounds checked- equal and bilateral Secured at: 23 cm Tube secured with: Tape Dental Injury: Teeth and Oropharynx as per pre-operative assessment
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
## 2018-11-06 NOTE — Consult Note (Signed)
Reason for Consult: Bilateral volar forearm and wrist lacerations Referring Physician: Dr. Cathren LaineKevin Fernandez  Brad MoorLarry Fernandez is an 38 y.o. male.  HPI: With a acute history of mental agitation and bilateral volar wrist lacerations of unknown origin who presents today with agitation and questionable tendon artery nerve injury to bilateral upper extremities.  History reviewed. No pertinent past medical history.  History reviewed. No pertinent surgical history.  No family history on file.  Social History:  reports that he has been smoking. He has never used smokeless tobacco. He reports current alcohol use. He reports that he does not use drugs.  Allergies: No Known Allergies  Medications: Prior to Admission: (Not in a hospital admission)   Results for orders placed or performed during the hospital encounter of 11/05/18 (from the past 48 hour(s))  Urinalysis, Routine w reflex microscopic     Status: Abnormal   Collection Time: 11/05/18  1:41 AM  Result Value Ref Range   Color, Urine STRAW (A) YELLOW   APPearance CLEAR CLEAR   Specific Gravity, Urine 1.002 (L) 1.005 - 1.030   pH 6.0 5.0 - 8.0   Glucose, UA NEGATIVE NEGATIVE mg/dL   Hgb urine dipstick LARGE (A) NEGATIVE   Bilirubin Urine NEGATIVE NEGATIVE   Ketones, ur NEGATIVE NEGATIVE mg/dL   Protein, ur NEGATIVE NEGATIVE mg/dL   Nitrite NEGATIVE NEGATIVE   Leukocytes, UA NEGATIVE NEGATIVE   RBC / HPF 0-5 0 - 5 RBC/hpf   Bacteria, UA RARE (A) NONE SEEN   Mucus PRESENT     Comment: Performed at [facility], [address] [place], [place] [address]  Rapid urine drug screen (hospital performed)     Status: Abnormal   Collection Time: 11/05/18  1:41 AM  Result Value Ref Range   Opiates NONE DETECTED NONE DETECTED   Cocaine NONE DETECTED NONE DETECTED   Benzodiazepines NONE DETECTED NONE DETECTED   Amphetamines NONE DETECTED NONE DETECTED   Tetrahydrocannabinol POSITIVE (A) NONE DETECTED   Barbiturates NONE DETECTED NONE  DETECTED    Comment: (NOTE) DRUG SCREEN FOR MEDICAL PURPOSES ONLY.  IF CONFIRMATION IS NEEDED FOR ANY PURPOSE, NOTIFY LAB WITHIN 5 DAYS. LOWEST DETECTABLE LIMITS FOR URINE DRUG SCREEN Drug Class                     Cutoff (ng/mL) Amphetamine and metabolites    1000 Barbiturate and metabolites    200 Benzodiazepine                 200 Tricyclics and metabolites     300 Opiates and metabolites        300 Cocaine and metabolites        300 THC                            50 Performed at [facility], [address] [place], [place] [address]   CBC with Differential     Status: Abnormal   Collection Time: 11/05/18 10:40 PM  Result Value Ref Range   WBC 13.4 (H) 4.0 - 10.5 K/uL   RBC 5.44 4.22 - 5.81 MIL/uL   Hemoglobin 15.7 13.0 - 17.0 g/dL   HCT 09.847.5 11.939.0 - 14.752.0 %   MCV 87.3 80.0 - 100.0 fL   MCH 28.9 26.0 - 34.0 pg   MCHC 33.1 30.0 - 36.0 g/dL   RDW 82.912.4 56.211.5 - 13.015.5 %   Platelets 294 150 -  400 K/uL   nRBC 0.0 0.0 - 0.2 %   Neutrophils Relative % 86 %   Neutro Abs 11.4 (H) 1.7 - 7.7 K/uL   Lymphocytes Relative 11 %   Lymphs Abs 1.5 0.7 - 4.0 K/uL   Monocytes Relative 3 %   Monocytes Absolute 0.4 0.1 - 1.0 K/uL   Eosinophils Relative 0 %   Eosinophils Absolute 0.0 0.0 - 0.5 K/uL   Basophils Relative 0 %   Basophils Absolute 0.1 0.0 - 0.1 K/uL   Immature Granulocytes 0 %   Abs Immature Granulocytes 0.04 0.00 - 0.07 K/uL    Comment: Performed at [facility], [address], [place], [place] [address]  Comprehensive metabolic panel     Status: Abnormal   Collection Time: 11/05/18 10:40 PM  Result Value Ref Range   Sodium 146 (H) 135 - 145 mmol/L   Potassium 3.6 3.5 - 5.1 mmol/L   Chloride 108 98 - 111 mmol/L   CO2 27 22 - 32 mmol/L   Glucose, Bld 107 (H) 70 - 99 mg/dL   BUN 6 6 - 20 mg/dL   Creatinine, Ser 8.88 0.61 - 1.24 mg/dL   Calcium 9.1 8.9 - 28.0 mg/dL   Total Protein 7.1 6.5 - 8.1 g/dL   Albumin 4.3 3.5 - 5.0 g/dL   AST 33 15 - 41 U/L   ALT 13  0 - 44 U/L   Alkaline Phosphatase 54 38 - 126 U/L   Total Bilirubin 0.5 0.3 - 1.2 mg/dL   GFR calc non Af Amer >60 >60 mL/min   GFR calc Af Amer >60 >60 mL/min   Anion gap 11 5 - 15    Comment: Performed at [facility], [address] [place], [place] [address]  Acetaminophen level     Status: Abnormal   Collection Time: 11/05/18 10:40 PM  Result Value Ref Range   Acetaminophen (Tylenol), Serum <10 (L) 10 - 30 ug/mL    Comment: (NOTE) Therapeutic concentrations vary significantly. A range of 10-30 ug/mL  may be an effective concentration for many patients. However, some  are best treated at concentrations outside of this range. Acetaminophen concentrations >150 ug/mL at 4 hours after ingestion  and >50 ug/mL at 12 hours after ingestion are often associated with  toxic reactions. Performed at [facility], [address], [place], [place] [address]   Ethanol     Status: Abnormal   Collection Time: 11/05/18 10:40 PM  Result Value Ref Range   Alcohol, Ethyl (B) 273 (H) <10 mg/dL    Comment: (NOTE) Lowest detectable limit for serum alcohol is 10 mg/dL. For medical purposes only. Performed at [facility], [address] [place], [place] [address]   Salicylate level     Status: None   Collection Time: 11/05/18 10:40 PM  Result Value Ref Range   Salicylate Lvl <7.0 2.8 - 30.0 mg/dL    Comment: Performed at [facility], [address] [place], [place] [address]  Type and screen [facility]     Status: None   Collection Time: 11/05/18 10:40 PM  Result Value Ref Range   ABO/RH(D) A POS    Antibody Screen NEG    Sample Expiration      11/08/2018 Performed at [facility], [address], [place], [place] [address]   CK     Status: Abnormal   Collection Time: 11/05/18 10:40 PM  Result Value  Ref Range   Total CK 659 (H) 49 - 397 U/L    Comment: Performed at [facility], [address] [place], [place] [address]  ABO/Rh      Status: None (Preliminary result)   Collection Time: 11/05/18 10:40 PM  Result Value Ref Range   ABO/RH(D)      A POS Performed at [facility], [address] [place], [place] [address]     No results found.  Review of Systems  All other systems reviewed and are negative.  Blood pressure 121/84, pulse 69, temperature 98.2 F (36.8 C), temperature source Oral, resp. rate 20, height 5\' 8"  (1.727 m), weight 63.5 kg, SpO2 95 %. Physical Exam  Constitutional: He appears well-developed and well-nourished.  HENT:  Head: Normocephalic and atraumatic.  Neck: Normal range of motion.  Cardiovascular: Normal rate.  Respiratory: Effort normal.  Musculoskeletal:     Left forearm: He exhibits laceration.     Comments: Complex left volar forearm and wrist laceration.  Complex volar right wrist volar laceration  Neurological: He is alert.  Skin: Skin is warm.    Assessment/Plan: Patient is a 38 year old male with bilateral volar forearm and wrist lacerations of unknown etiology with significant agitation that required general anesthetic for expiration repair of bilateral upper extremity complex lacerations  Marlowe ShoresMatthew A Lakena Sparlin 11/06/2018, 12:17 AM

## 2018-11-06 NOTE — Anesthesia Preprocedure Evaluation (Signed)
Anesthesia Evaluation  Patient identified by MRN, date of birth, ID band Patient confused    Reviewed: Allergy & Precautions, NPO status , Patient's Chart, lab work & pertinent test results  Airway Mallampati: II  TM Distance: >3 FB Neck ROM: Full    Dental  (+) Dental Advisory Given, Missing   Pulmonary Current Smoker,    Pulmonary exam normal breath sounds clear to auscultation       Cardiovascular negative cardio ROS   Rhythm:Regular Rate:Tachycardia     Neuro/Psych negative neurological ROS     GI/Hepatic negative GI ROS, (+)     substance abuse  alcohol use and marijuana use,   Endo/Other  negative endocrine ROS  Renal/GU negative Renal ROS     Musculoskeletal BILATERAL FOREARM LACERATION   Abdominal   Peds  Hematology negative hematology ROS (+)   Anesthesia Other Findings Day of surgery medications reviewed with the patient.  Reproductive/Obstetrics                             Anesthesia Physical Anesthesia Plan  ASA: II and emergent  Anesthesia Plan: General   Post-op Pain Management:    Induction: Intravenous, Rapid sequence and Cricoid pressure planned  PONV Risk Score and Plan: 2 and Midazolam, Dexamethasone and Ondansetron  Airway Management Planned: Oral ETT  Additional Equipment:   Intra-op Plan:   Post-operative Plan: Extubation in OR  Informed Consent: I have reviewed the patients History and Physical, chart, labs and discussed the procedure including the risks, benefits and alternatives for the proposed anesthesia with the patient or authorized representative who has indicated his/her understanding and acceptance.     Dental advisory given, Only emergency history available and History available from chart only  Plan Discussed with: CRNA  Anesthesia Plan Comments: (Patient non-cooperative. History from chart. Emergency surgery.)         Anesthesia Quick Evaluation

## 2018-11-06 NOTE — Op Note (Signed)
Please see dictated report [phone]

## 2018-11-06 NOTE — Progress Notes (Signed)
Pt returned to PACU.  Pt hooked up to EKG monitor.  Jewelry and Dentures given to EMT Earl Lites.  RN and EMT at bedside.  Pt still in 4 point restraints.  HOB elevated.  Hand off report complete via telephone.

## 2018-11-06 NOTE — Progress Notes (Signed)
Pt arrived from OR VSS in 4 point restraints.

## 2018-11-06 NOTE — Op Note (Signed)
NAMKellie Fernandez: Brad Fernandez, [name] MEDICAL RECORD [id number] ACCOUNT [id number] DATE OF BIRTH:1981-03-15 FACILITY: [facility] LOCATION: [facility] PHYSICIAN:Querida Beretta A. Mina MarbleWEINGOLD, MD  OPERATIVE REPORT  DATE OF PROCEDURE:  11/06/2018  PREOPERATIVE DIAGNOSIS:  Complex laceration volar aspects forearms bilaterally.  POSTOPERATIVE DIAGNOSIS:  Complex laceration volar aspects forearms bilaterally.  PROCEDURE:  Exploration traumatic violent wounds, proximal and distal forearms with primary closure and repair of left forearm flexor tendon.  SURGEON:  Dairl PonderMatthew Nyazia Canevari, MD  ASSISTANT:  None.  ANESTHESIA:  General.  COMPLICATIONS:  No complications.  DRAINS:  No drains.  DESCRIPTION OF PROCEDURE:  The patient was taken to the operating suite after induction of adequate general anesthetic, bilateral upper extremities were prepped and draped in the usual sterile fashion.  The right upper extremity had a laceration, 4 cm in  length just distal to the antecubital fossa.  This wound was irrigated and explored and loosely closed with staples.  The left upper extremity was then prepped and draped in the usual sterile fashion.  An Esmarch was used to exsanguinate the limb.   Tourniquet was inflated to 250 mmHg.  At this point in time, a complex laceration involving the volar ulnar forearm from the mid shaft to the wrist was explored.  The neurovascular bundle on the ulnar side was intact.  There was a laceration of the  flexor carpi ulnaris proximally, which was repaired using 3-0 Vicryl.  The wound was thoroughly irrigated and loosely closed in layers of Vicryl subcutaneously and staples on the skin.  Xeroform, 4 x 4's, and a compressive wrap was applied.  The patient  tolerated bilateral procedures well and went to recovery room in stable fashion.  TN/NUANCE  D:11/06/2018 T:11/06/2018 JOB:[id number]

## 2018-11-07 ENCOUNTER — Encounter (HOSPITAL_COMMUNITY): Payer: Self-pay | Admitting: Orthopedic Surgery

## 2018-11-30 ENCOUNTER — Ambulatory Visit (INDEPENDENT_AMBULATORY_CARE_PROVIDER_SITE_OTHER): Payer: Medicaid Other | Admitting: Licensed Clinical Social Worker

## 2018-11-30 ENCOUNTER — Encounter (HOSPITAL_COMMUNITY): Payer: Self-pay | Admitting: Licensed Clinical Social Worker

## 2018-11-30 DIAGNOSIS — F331 Major depressive disorder, recurrent, moderate: Secondary | ICD-10-CM
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
## 2018-12-28 ENCOUNTER — Encounter (HOSPITAL_COMMUNITY): Payer: Self-pay | Admitting: Psychiatry

## 2018-12-28 ENCOUNTER — Other Ambulatory Visit: Payer: Self-pay

## 2018-12-28 ENCOUNTER — Ambulatory Visit (HOSPITAL_COMMUNITY): Payer: Medicaid Other | Admitting: Psychiatry

## 2018-12-28 VITALS — BP 118/82 | HR 86 | Ht 68.0 in | Wt 137.0 lb

## 2018-12-28 DIAGNOSIS — Z915 Personal history of self-harm: Secondary | ICD-10-CM | POA: Diagnosis not present

## 2018-12-28 DIAGNOSIS — F331 Major depressive disorder, recurrent, moderate: Secondary | ICD-10-CM

## 2018-12-28 DIAGNOSIS — F1094 Alcohol use, unspecified with alcohol-induced mood disorder: Secondary | ICD-10-CM | POA: Insufficient documentation

## 2018-12-28 DIAGNOSIS — F10920 Alcohol use, unspecified with intoxication, uncomplicated: Secondary | ICD-10-CM

## 2018-12-28 LAB — TSH: TSH: 1.22 m[IU]/L (ref 0.40–4.50)

## 2018-12-28 MED ORDER — SERTRALINE HCL 50 MG PO TABS: ORAL_TABLET | ORAL | 0 refills | Status: DC

## 2018-12-28 NOTE — Patient Instructions (Signed)
1. Start sertraline 25 mg daily for one week, then 50 mg daily  2. Return to clinic in one month for 30 mins 3. Check TSH 3. CONTACT INFORMATION  What to do if you need to get in touch with someone regarding a psychiatric issue:  1. EMERGENCY: For psychiatric emergencies (if you are suicidal or if there are any other safety issues) call 911 and/or go to your nearest Emergency Room immediately.   2. IF YOU NEED SOMEONE TO TALK TO RIGHT NOW: Given my clinical responsibilities, I may not be able to speak with you over the phone for a prolonged period of time.  a. You may always call The National Suicide Prevention Lifeline at 1-800-273-TALK (956)346-9821).  b. Your county of residence will also have local crisis services. For [facility]: [facility] at [phone] (24 Hour Crisis Hotline)

## 2019-01-04 ENCOUNTER — Ambulatory Visit (HOSPITAL_COMMUNITY): Payer: Medicaid Other | Admitting: Licensed Clinical Social Worker

## 2019-01-17 ENCOUNTER — Other Ambulatory Visit: Payer: Self-pay

## 2019-01-17 ENCOUNTER — Ambulatory Visit (HOSPITAL_COMMUNITY): Payer: Medicaid Other | Admitting: Licensed Clinical Social Worker

## 2019-01-30 ENCOUNTER — Telehealth (HOSPITAL_COMMUNITY): Payer: Self-pay | Admitting: Psychiatry

## 2019-01-30 MED ORDER — SERTRALINE HCL 50 MG PO TABS: 50.0000 mg | ORAL_TABLET | Freq: Every day | ORAL | 0 refills | Status: AC

## 2019-01-30 NOTE — Telephone Encounter (Signed)
Ordered refill for sertraline per request. Please contact the patient to make follow up appointment.  

## 2019-01-30 NOTE — Telephone Encounter (Signed)
Wireless customer unavailable per recording

## 2019-01-31 ENCOUNTER — Ambulatory Visit (HOSPITAL_COMMUNITY): Payer: Medicaid Other | Admitting: Licensed Clinical Social Worker

## 2019-01-31 ENCOUNTER — Other Ambulatory Visit: Payer: Self-pay

## 2020-03-04 ENCOUNTER — Other Ambulatory Visit: Payer: Self-pay

## 2020-03-04 ENCOUNTER — Ambulatory Visit (INDEPENDENT_AMBULATORY_CARE_PROVIDER_SITE_OTHER): Payer: Medicaid Other | Admitting: Clinical

## 2020-03-04 DIAGNOSIS — F331 Major depressive disorder, recurrent, moderate: Secondary | ICD-10-CM | POA: Diagnosis not present
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
## 2020-05-02 ENCOUNTER — Other Ambulatory Visit: Payer: Self-pay

## 2020-05-02 ENCOUNTER — Emergency Department (HOSPITAL_COMMUNITY)
Admission: EM | Admit: 2020-05-02 | Discharge: 2020-05-02 | Disposition: A | Discharge status: Home / Self Care | Payer: Medicaid Other | Attending: Emergency Medicine | Admitting: Emergency Medicine

## 2020-05-02 ENCOUNTER — Emergency Department (HOSPITAL_COMMUNITY): Payer: Medicaid Other

## 2020-05-02 ENCOUNTER — Encounter (HOSPITAL_COMMUNITY): Payer: Self-pay | Admitting: Emergency Medicine

## 2020-05-02 DIAGNOSIS — F17210 Nicotine dependence, cigarettes, uncomplicated: Secondary | ICD-10-CM | POA: Insufficient documentation

## 2020-05-02 DIAGNOSIS — N451 Epididymitis: Secondary | ICD-10-CM | POA: Insufficient documentation

## 2020-05-02 DIAGNOSIS — N50812 Left testicular pain: Secondary | ICD-10-CM | POA: Insufficient documentation

## 2020-05-02 LAB — BASIC METABOLIC PANEL
Anion gap: 10 (ref 5–15)
BUN: 9 mg/dL (ref 6–20)
CO2: 26 mmol/L (ref 22–32)
Calcium: 9.1 mg/dL (ref 8.9–10.3)
Chloride: 99 mmol/L (ref 98–111)
Creatinine, Ser: 1.04 mg/dL (ref 0.61–1.24)
GFR calc Af Amer: >60 mL/min (ref >60)
GFR calc non Af Amer: >60 mL/min (ref >60)
Glucose, Bld: 112 mg/dL — ABNORMAL HIGH (ref 70–99)
Potassium: 3.6 mmol/L (ref 3.5–5.1)
Sodium: 135 mmol/L (ref 135–145)

## 2020-05-02 LAB — CBC WITH DIFFERENTIAL/PLATELET
Abs Immature Granulocytes: 0.07 10*3/uL (ref 0.00–0.07)
Basophils Absolute: 0.1 10*3/uL (ref 0.0–0.1)
Basophils Relative: 0 %
Eosinophils Absolute: 0.2 10*3/uL (ref 0.0–0.5)
Eosinophils Relative: 1 %
HCT: 46.1 % (ref 39.0–52.0)
Hemoglobin: 15.2 g/dL (ref 13.0–17.0)
Immature Granulocytes: 0 %
Lymphocytes Relative: 9 %
Lymphs Abs: 1.7 10*3/uL (ref 0.7–4.0)
MCH: 30.6 pg (ref 26.0–34.0)
MCHC: 33 g/dL (ref 30.0–36.0)
MCV: 92.8 fL (ref 80.0–100.0)
Monocytes Absolute: 1.3 10*3/uL — ABNORMAL HIGH (ref 0.1–1.0)
Monocytes Relative: 7 %
Neutro Abs: 15.9 10*3/uL — ABNORMAL HIGH (ref 1.7–7.7)
Neutrophils Relative %: 83 %
Platelets: 263 10*3/uL (ref 150–400)
RBC: 4.97 MIL/uL (ref 4.22–5.81)
RDW: 12.8 % (ref 11.5–15.5)
WBC: 19.2 10*3/uL — ABNORMAL HIGH (ref 4.0–10.5)
nRBC: 0 % (ref 0.0–0.2)

## 2020-05-02 LAB — URINALYSIS, ROUTINE W REFLEX MICROSCOPIC
Bilirubin Urine: NEGATIVE
Glucose, UA: NEGATIVE mg/dL
Ketones, ur: NEGATIVE mg/dL
Nitrite: POSITIVE — AB
Protein, ur: 100 mg/dL — AB
RBC / HPF: >50 RBC/hpf — ABNORMAL HIGH (ref 0–5)
Specific Gravity, Urine: 1.021 (ref 1.005–1.030)
WBC, UA: >50 WBC/hpf — ABNORMAL HIGH (ref 0–5)
pH: 6 (ref 5.0–8.0)

## 2020-05-02 MED ORDER — IBUPROFEN 800 MG PO TABS: 800.0000 mg | ORAL_TABLET | Freq: Three times a day (TID) | ORAL | 0 refills | Status: AC

## 2020-05-02 MED ORDER — DOXYCYCLINE HYCLATE 100 MG PO CAPS
100.0000 mg | ORAL_CAPSULE | Freq: Two times a day (BID) | ORAL | 0 refills | Status: AC
Start: 2020-05-02 — End: ?

## 2020-05-02 MED ORDER — LIDOCAINE HCL (PF) 1 % IJ SOLN
INTRAMUSCULAR | Status: AC
  Administered 2020-05-02: 1 mL
  Filled 2020-05-02: qty 30

## 2020-05-02 MED ORDER — CEFTRIAXONE SODIUM 500 MG IJ SOLR
500.0000 mg | Freq: Once | INTRAMUSCULAR | Status: AC
Start: 2020-05-02 — End: 2020-05-02
  Administered 2020-05-02: 500 mg via INTRAMUSCULAR
  Filled 2020-05-02: qty 500

## 2020-05-02 MED ORDER — OXYCODONE-ACETAMINOPHEN 5-325 MG PO TABS
1.0000 | ORAL_TABLET | Freq: Once | ORAL | Status: AC
  Administered 2020-05-02: 1 via ORAL
  Filled 2020-05-02: qty 1

## 2020-05-02 NOTE — Discharge Instructions (Addendum)
Your urine test and ultrasound tonight showed that you have an infection called epididymitis.  This is treated with antibiotics.  Be sure to take the prescription antibiotic as directed until its finished.  Elevation of your testicle and ice packs on and off may help with the discomfort as well as ibuprofen.  You may call the urology office listed to arrange a follow-up appointment.

## 2020-05-02 NOTE — ED Provider Notes (Signed)
[facility] EMERGENCY DEPARTMENT Provider Note   CSN: [id number] Arrival date & time: 05/02/20  1700     History Chief Complaint  Patient presents with  . Testicle Pain    Brad Fernandez is a 39 y.o. male.  HPI     Brad Fernandez is a 39 y.o. male who presents to the Emergency Department complaining of pain and swelling of his left testicle for 1 day.  He reports sudden onset of pain yesterday, noticed increased swelling of his testicle today.  He was seen at urgent care and advised to come to the emergency department for further evaluation and likely ultrasound.  He denies known injury or heavy lifting.  No prior testicular issues, he denies dysuria, abdominal pain, back pain, fever or chills.  No penile discharge, or new sexual partners.  No history of inguinal hernias.  History reviewed. No pertinent past medical history.  Patient Active Problem List   Diagnosis Date Noted  . 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 LACERATION REPAIR Right 11/06/2018   Procedure: REPAIR RIGHT FOREARM LACERATIONS, REPAIR OF LEFT FOREARM LACERATIONS WITH MUSCLE REPAIR.;  Surgeon: Dairl Ponder, MD;  Location: [facility] OR;  Service: Orthopedics;  Laterality: Right;       No family history on file.  Social History   Tobacco Use  . Smoking status: Current Every Day Smoker  . Smokeless tobacco: Never Used  Vaping Use  . Vaping Use: Never used  Substance Use Topics  . Alcohol use: Yes    Alcohol/week: 4.0 standard drinks    Types: 4 Shots of liquor per week  . Drug use: Yes    Types: Marijuana    Home Medications Prior to Admission medications   Medication Sig Start Date End Date Taking? Authorizing Provider  sertraline (ZOLOFT) 50 MG tablet Take 1 tablet (50 mg total) by mouth daily. 01/30/19   Neysa Hotter, MD    Allergies    Patient has no known allergies.  Review of Systems   Review of  Systems  Constitutional: Negative for chills and fever.  Respiratory: Negative for cough and shortness of breath.   Cardiovascular: Negative for chest pain.  Gastrointestinal: Negative for abdominal pain, nausea and vomiting.  Genitourinary: Positive for scrotal swelling and testicular pain. Negative for difficulty urinating, discharge, dysuria, flank pain, hematuria, penile pain and penile swelling.  Musculoskeletal: Negative for arthralgias, back pain and myalgias.  Skin: Negative for rash.  Neurological: Negative for dizziness, weakness and numbness.  Hematological: Does not bruise/bleed easily.    Physical Exam Updated Vital Signs BP (!) 139/85 (BP Location: Right Arm)   Pulse (!) 115   Temp 98.8 F (37.1 C) (Oral)   Resp 18   Ht 5\' 8"  (1.727 m)   Wt 65.8 kg   SpO2 100%   BMI 22.05 kg/m   Physical Exam Vitals and nursing note reviewed. Exam conducted with a chaperone present.  Constitutional:      General: He is not in acute distress.    Appearance: Normal appearance. He is not ill-appearing.  Cardiovascular:     Rate and Rhythm: Normal rate and regular rhythm.     Pulses: Normal pulses.  Pulmonary:     Effort: Pulmonary effort is normal.     Breath sounds: Normal breath sounds.  Chest:     Chest wall: No tenderness.  Abdominal:     Palpations: Abdomen is soft. There is no mass.  Tenderness: There is no abdominal tenderness. There is no right CVA tenderness, left CVA tenderness or guarding.     Hernia: There is no hernia in the left inguinal area.  Genitourinary:    Penis: Normal and circumcised.      Testes: Cremasteric reflex is present.        Left: Tenderness and swelling present.     Comments: Chaperoned by nurse aide Musculoskeletal:        General: Normal range of motion.     Cervical back: Normal range of motion.  Lymphadenopathy:     Lower Body: No left inguinal adenopathy.  Skin:    General: Skin is warm.     Findings: No erythema or rash.    Neurological:     Mental Status: He is alert.     ED Results / Procedures / Treatments   Labs (all labs ordered are listed, but only abnormal results are displayed) Labs Reviewed  URINALYSIS, ROUTINE W REFLEX MICROSCOPIC - Abnormal; Notable for the following components:      Result Value   Color, Urine AMBER (*)    APPearance CLOUDY (*)    Hgb urine dipstick LARGE (*)    Protein, ur 100 (*)    Nitrite POSITIVE (*)    Leukocytes,Ua MODERATE (*)    RBC / HPF >50 (*)    WBC, UA >50 (*)    Bacteria, UA RARE (*)    All other components within normal limits  BASIC METABOLIC PANEL - Abnormal; Notable for the following components:   Glucose, Bld 112 (*)    All other components within normal limits  CBC WITH DIFFERENTIAL/PLATELET - Abnormal; Notable for the following components:   WBC 19.2 (*)    Neutro Abs 15.9 (*)    Monocytes Absolute 1.3 (*)    All other components within normal limits  URINE CULTURE  GC/CHLAMYDIA PROBE AMP ([place]) NOT AT [facility]    EKG None  Radiology US SCROTUM W/DOPPLER  Result Date: 05/02/2020 CLINICAL DATA:  Left testicular pain for 1 day EXAM: SCROTAL ULTRASOUND DOPPLER ULTRASOUND OF THE TESTICLES TECHNIQUE: Complete ultrasound examination of the testicles, epididymis, and other scrotal structures was performed. Color and spectral Doppler ultrasound were also utilized to evaluate blood flow to the testicles. COMPARISON:  None. FINDINGS: Right testicle Measurements: 3.3 x 4.9 x 1.9 cm. No mass or microlithiasis visualized. Left testicle Measurements: 3.2 x 4.6 x 2.7 cm. No mass or microlithiasis visualized. Right epididymis: Grossly normal in size and appearance. 2 mm epididymal cyst of doubtful clinical significance. Left epididymis: Left epididymis is enlarged and ill-defined, with marked increased vascularity compatible with epididymitis. Hydrocele: There small bilateral hydroceles, left greater than right. Varicocele:  None visualized. Pulsed Doppler  interrogation of both testes demonstrates normal low resistance arterial and venous waveforms bilaterally. IMPRESSION: 1. Enlarged heterogeneous left epididymis, with marked increased vascularity compatible with epididymitis. 2. Small bilateral hydroceles, left greater than right. 3. Unremarkable bilateral testes. Electronically Signed   By: Sharlet Salina M.D.   On: 05/02/2020 22:20    Procedures Procedures (including critical care time)  Medications Ordered in ED Medications  oxyCODONE-acetaminophen (PERCOCET/ROXICET) 5-325 MG per tablet 1 tablet (has no administration in time range)    ED Course  I have reviewed the triage vital signs and the nursing notes.  Pertinent labs & imaging results that were available during my care of the patient were reviewed by me and considered in my medical decision making (see chart for details).  MDM Rules/Calculators/A&P                          Patient here with 1 day history of pain and swelling of the left testicle.  Diffusely tender on exam, moderate edema.  Patient is well-appearing.  Nontoxic.  Will call in ultrasound to rule out testicular torsion.  On recheck, patient resting comfortably, urinalysis shows positive nitrite and greater than 50 WBCs and red cells, moderate leukocytes.  Urine culture and GC chlamydia pending.  Ultrasound of the scrotum with Doppler shows normal blood flow, no torsion.  Acute epididymitis.  Patient given IM Rocephin here, prescription for doxycycline and ibuprofen.  Referral information provided for urology.  Return precautions discussed.   Final Clinical Impression(s) / ED Diagnoses Final diagnoses:  Pain in left testicle  Acute epididymitis    Rx / DC Orders ED Discharge Orders    None       Rosey Bath 05/02/20 2309    Raeford Razor, MD 05/04/20 2243

## 2020-05-02 NOTE — ED Triage Notes (Signed)
Pt reports he was seen at urgent care and sent for u/s for left-sided testicular pain x 1 day

## 2020-05-05 LAB — URINE CULTURE: Culture: >=100000 — AB

## 2020-05-06 ENCOUNTER — Telehealth: Payer: Self-pay | Admitting: *Deleted

## 2020-05-06 NOTE — Progress Notes (Signed)
ED Antimicrobial Stewardship Positive Culture Follow Up   Brad Fernandez is an 39 y.o. male who presented to [facility] on 05/02/2020 with a chief complaint of  Chief Complaint  Patient presents with   Testicle Pain    Recent Results (from the past 720 hour(s))  Urine culture     Status: Abnormal   Collection Time: 05/02/20 10:15 PM   Specimen: Urine, Clean Catch  Result Value Ref Range Status   Specimen Description   Final    URINE, CLEAN CATCH Performed at [facility], [address], [place], [place] [address]    Special Requests   Final    NONE Performed at [facility], [address] [place], [place] [address]    Culture >=100,000 COLONIES/mL ESCHERICHIA COLI (A)  Final   Report Status 05/05/2020 FINAL  Final   Organism ID, Bacteria ESCHERICHIA COLI (A)  Final      Susceptibility   Escherichia coli - MIC*    AMPICILLIN <=2 SENSITIVE Sensitive     CEFAZOLIN <=4 SENSITIVE Sensitive     CEFTRIAXONE <=0.25 SENSITIVE Sensitive     CIPROFLOXACIN <=0.25 SENSITIVE Sensitive     GENTAMICIN <=1 SENSITIVE Sensitive     IMIPENEM <=0.25 SENSITIVE Sensitive     NITROFURANTOIN <=16 SENSITIVE Sensitive     TRIMETH/SULFA <=20 SENSITIVE Sensitive     AMPICILLIN/SULBACTAM <=2 SENSITIVE Sensitive     PIP/TAZO <=4 SENSITIVE Sensitive     * >=100,000 COLONIES/mL ESCHERICHIA COLI    [x]  Treated with doxy for acute epididymitis, organism resistant to prescribed antimicrobial  New antibiotic prescription: Flow Manager to call for symptom check. If patient feeling better, back to normal, then continue doxy for full 10-day course. If patient still having symptoms, stop doxy and start Bactrim 1 DS tablet PO BID x 10 days.  ED Provider: Robinson, PA-C   Brad Fernandez 05/06/2020, 8:00 AM Clinical Pharmacist Monday - Friday phone -  [phone] Saturday - Sunday phone - [phone]

## 2020-05-06 NOTE — Telephone Encounter (Signed)
Post ED Visit - Positive Culture Follow-up: Unsuccessful Patient Follow-up  Culture assessed and recommendations reviewed by:  []  , Pharm.D. []  Enzo Bi, Pharm.D., BCPS AQ-ID []  , Pharm.D., BCPS []  Celedonio Miyamoto, .D., BCPS []  Bronson, .D., BCPS, AAHIVP []  Georgina Pillion, Pharm.D., BCPS, AAHIVP []  [address], PharmD []  , PharmD, BCPS  Positive urine culture  []  Patient discharged without antimicrobial prescription and treatment is now indicated []  Organism is resistant to prescribed ED discharge antimicrobial []  Patient with positive blood cultures  Plan:  If feeling better continue doxy for full 10 day course.  If still having symptoms, stop doxy and start Bactrim DS 1 tab PO BID x 10 days, [place] Robinson, PA-C.  Inform pt GC/Chlamydia test didin't process, encourage testing at [facility] or PCP  Unable to contact patient after 3 attempts, letter will be sent to address on file  [address] 05/06/2020, 2:12 PM

## 2020-11-24 IMAGING — US US SCROTUM W/ DOPPLER COMPLETE
1 series · 13 of 25 positions shown · non-contrast
Comparison: None.

CLINICAL DATA: Left testicular pain for 1 day

EXAM:
SCROTAL ULTRASOUND
DOPPLER ULTRASOUND OF THE TESTICLES
TECHNIQUE: Complete ultrasound examination of the testicles, epididymis, and
other scrotal structures was performed. Color and spectral Doppler
ultrasound were also utilized to evaluate blood flow to the
testicles.

[Series 1: us scrotum w/doppler · 13 of 94 slices shown]
[im 1/94]
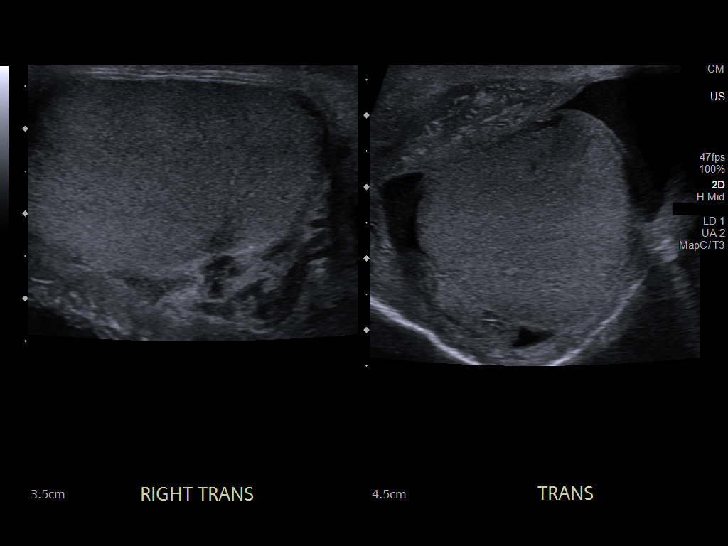
[im 8/94]
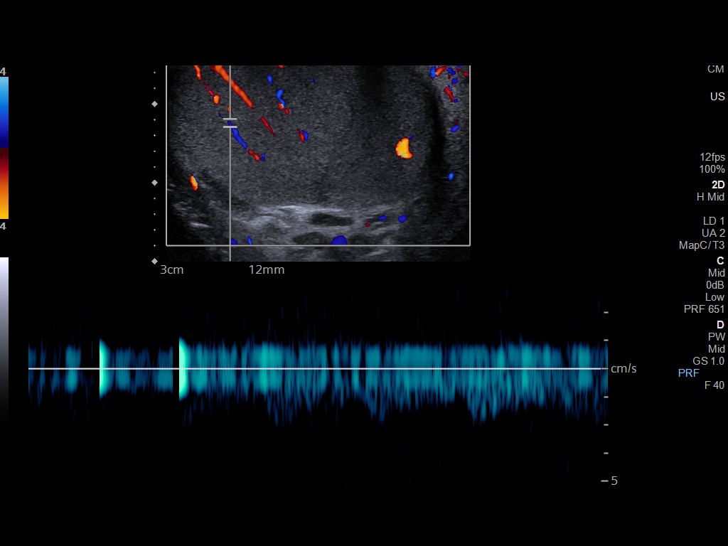
[im 16/94]
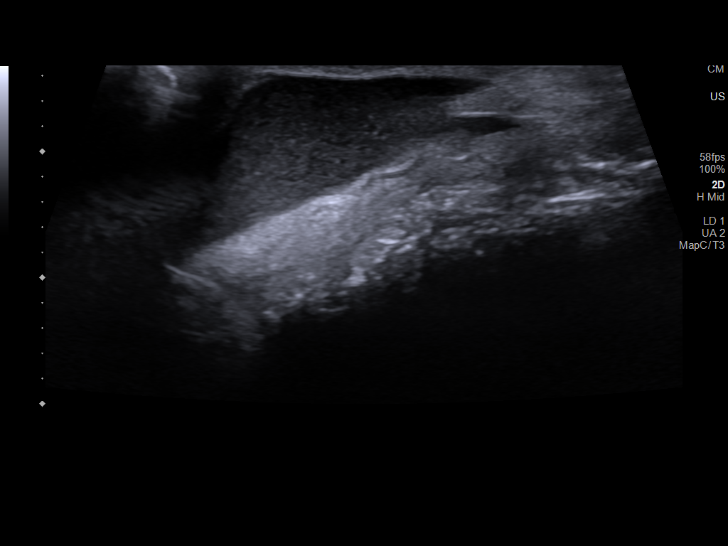
[im 24/94]
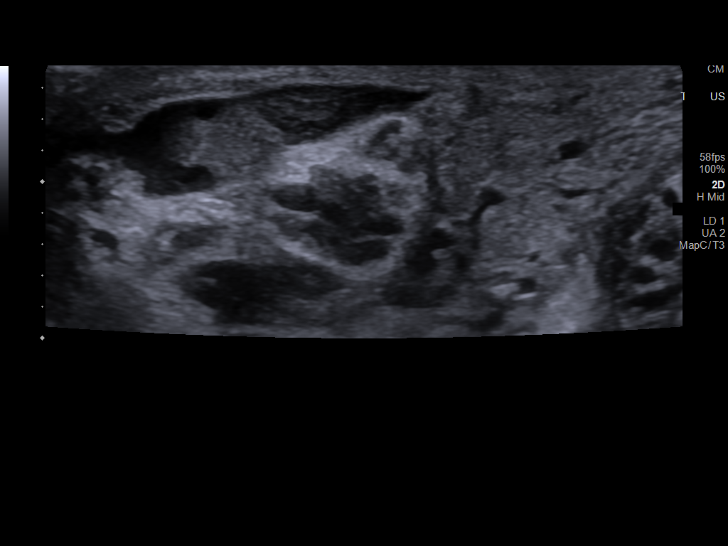
[im 32/94]
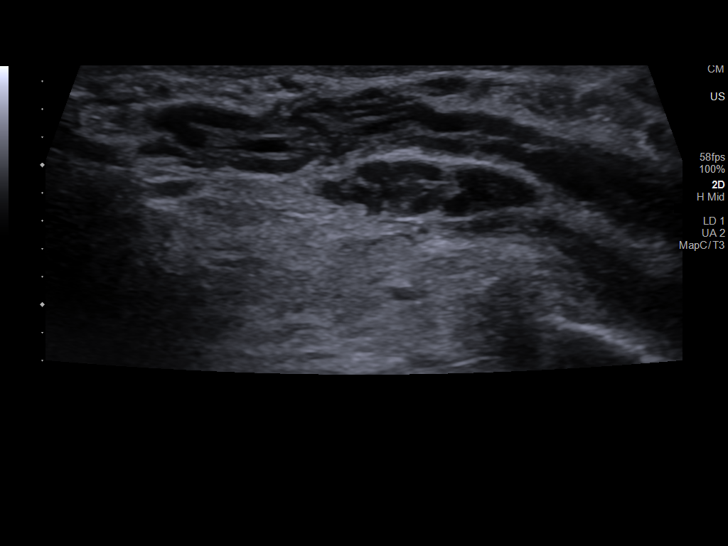
[im 39/94]
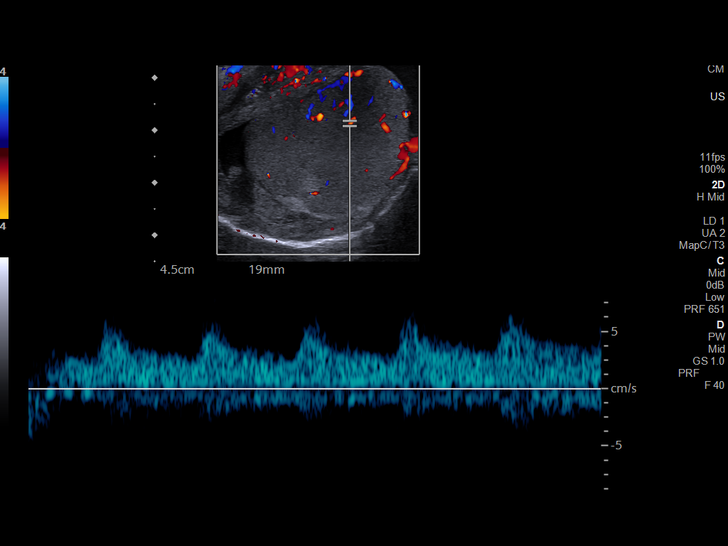
[im 47/94]
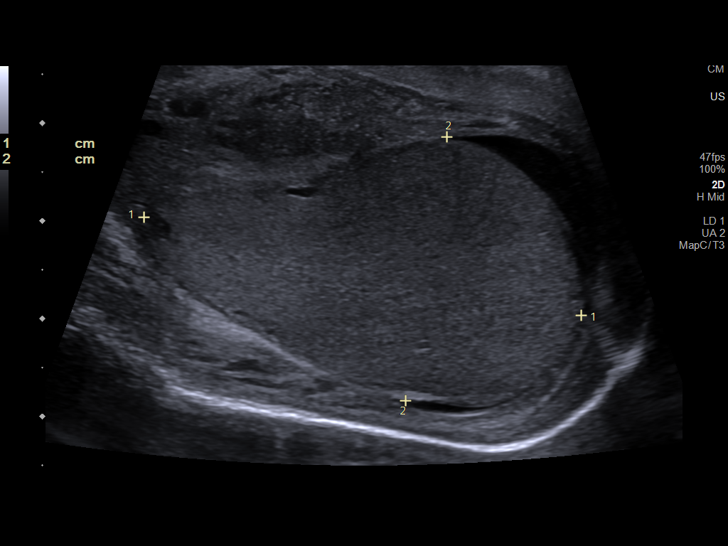
[im 55/94]
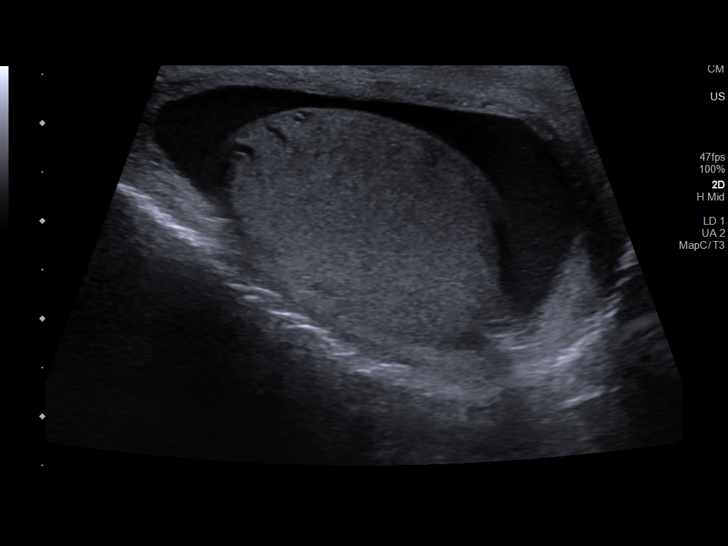
[im 63/94]
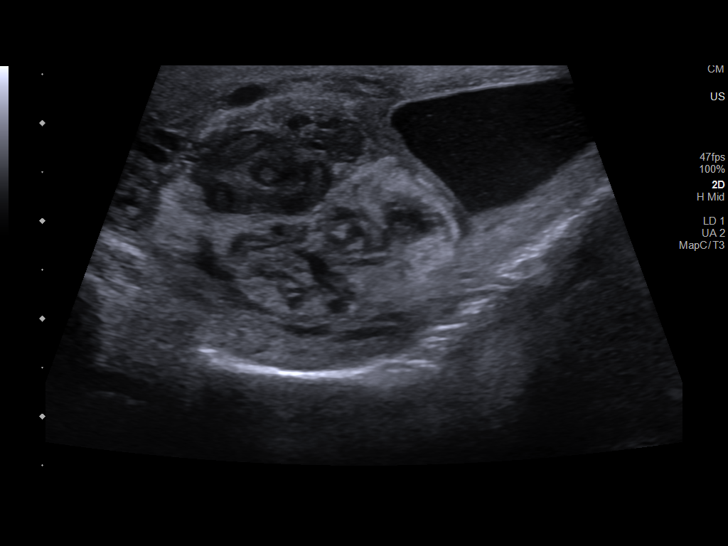
[im 70/94]
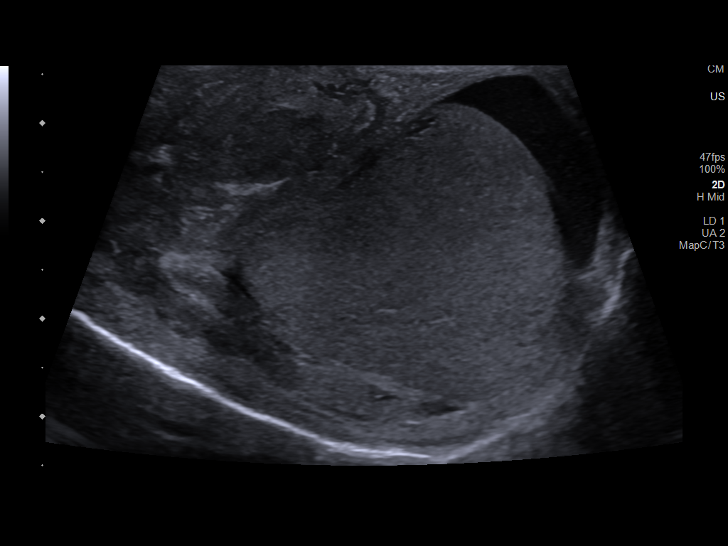
[im 78/94]
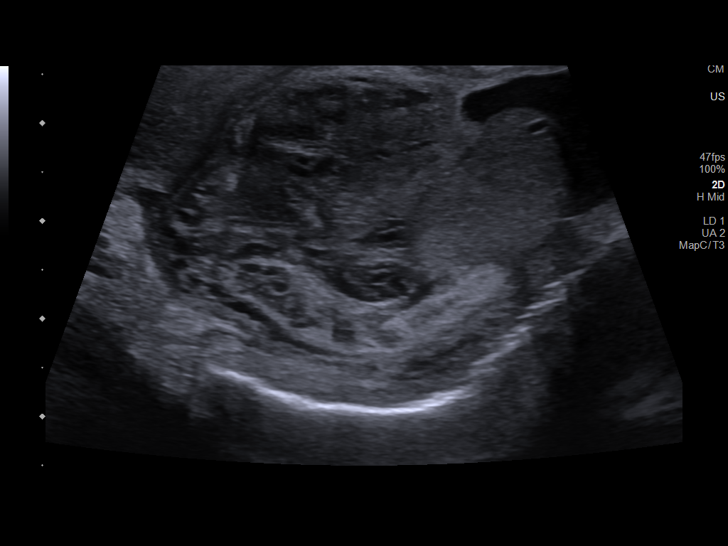
[im 86/94]
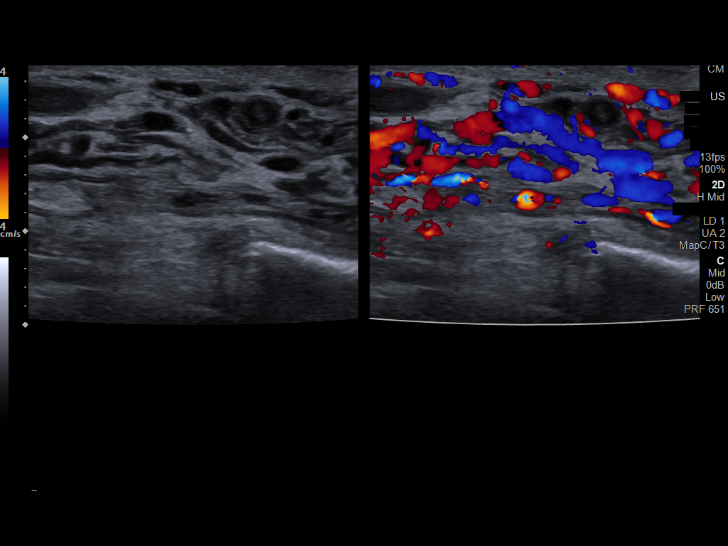
[im 94/94]
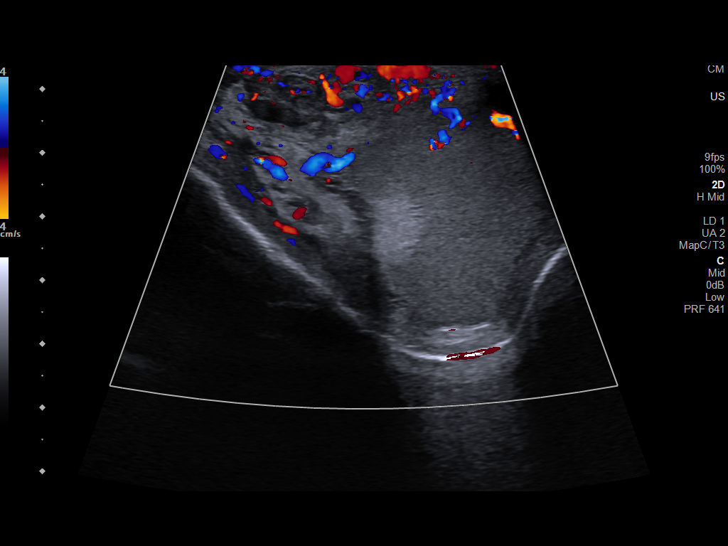

[13 of 25 positions shown; findings below may reference images not displayed]

FINDINGS: Right testicle

Measurements: 3.3 x 4.9 x 1.9 cm. No mass or microlithiasis
visualized.

Left testicle

Measurements: 3.2 x 4.6 x 2.7 cm. No mass or microlithiasis
visualized.

Right epididymis: Grossly normal in size and appearance. 2 mm
epididymal cyst of doubtful clinical significance.

Left epididymis: Left epididymis is enlarged and ill-defined, with
marked increased vascularity compatible with epididymitis.

Hydrocele: There small bilateral hydroceles, left greater than
right.

Varicocele:  None visualized.

Pulsed Doppler interrogation of both testes demonstrates normal low
resistance arterial and venous waveforms bilaterally.
IMPRESSION: 1. Enlarged heterogeneous left epididymis, with marked increased
vascularity compatible with epididymitis.
2. Small bilateral hydroceles, left greater than right.
3. Unremarkable bilateral testes.
